# Patient Record
Sex: Female | Born: 1980 | Race: Black or African American | Hispanic: No | Marital: Married | Smoking: Never smoker
Health system: Southern US, Community
[De-identification: ages and names within clinical notes are randomized; demographics above are authoritative.]

## PROBLEM LIST (undated history)

## (undated) DIAGNOSIS — E785 Hyperlipidemia, unspecified: Secondary | ICD-10-CM

## (undated) DIAGNOSIS — B019 Varicella without complication: Secondary | ICD-10-CM

## (undated) HISTORY — PX: OTHER SURGICAL HISTORY: SHX169

## (undated) HISTORY — DX: Hyperlipidemia, unspecified: E78.5

## (undated) HISTORY — PX: BREAST BIOPSY: SHX20

## (undated) HISTORY — DX: Varicella without complication: B01.9

---

## 2011-08-08 LAB — HM PAP SMEAR: HM Pap smear: NORMAL

## 2012-05-05 ENCOUNTER — Ambulatory Visit: Payer: Self-pay | Admitting: Emergency Medicine

## 2012-05-05 VITALS — BP 120/78 | HR 80 | Temp 98.3°F | Resp 16 | Ht 62.5 in | Wt 156.6 lb

## 2012-05-05 DIAGNOSIS — H6120 Impacted cerumen, unspecified ear: Secondary | ICD-10-CM

## 2012-05-05 DIAGNOSIS — H60339 Swimmer's ear, unspecified ear: Secondary | ICD-10-CM

## 2012-05-05 DIAGNOSIS — H66009 Acute suppurative otitis media without spontaneous rupture of ear drum, unspecified ear: Secondary | ICD-10-CM

## 2012-05-05 MED ORDER — CIPROFLOXACIN-HYDROCORTISONE 0.2-1 % OT SUSP: 3.0000 [drp] | Freq: Two times a day (BID) | OTIC | Status: DC

## 2012-05-05 MED ORDER — AMOXICILLIN-POT CLAVULANATE 875-125 MG PO TABS: 1.0000 | ORAL_TABLET | Freq: Two times a day (BID) | ORAL | Status: DC

## 2012-05-05 NOTE — Progress Notes (Signed)
[facility] [address], [place] [address] [id number]  Date:  05/05/2012   Name:  Alexandra Bradley   DOB:  12/08/1980   MRN:  [id number]  PCP:  No primary provider on file.    Chief Complaint: Otalgia   History of Present Illness:  Alexandra Bradley is a 31 y.o. very pleasant female patient who presents with the following:  Ill all week with nasal congestion and pain in ear (left).  Has echo and ringing in left ear.  No fever or chills, nausea or vomiting,  No rash, no cough or stool change,  No shortness of breath or wheezing. No improvement with otc medication.  There is no problem list on file for this patient.   No past medical history on file.  No past surgical history on file.  History  Substance Use Topics  . Smoking status: Never Smoker   . Smokeless tobacco: Not on file  . Alcohol Use: No    No family history on file.  No Known Allergies  Medication list has been reviewed and updated.  No current outpatient prescriptions on file prior to visit.    Review of Systems:  As per HPI, otherwise negative.    Physical Examination: Filed Vitals:   05/05/12 1603  BP: 120/78  Pulse: 80  Temp: 98.3 F (36.8 C)  Resp: 16   Filed Vitals:   05/05/12 1603  Height: 5' 2.5" (1.588 m)  Weight: 156 lb 9.6 oz (71.033 kg)   Body mass index is 28.19 kg/(m^2). Ideal Body Weight: Weight in (lb) to have BMI = 25: 138.6    GEN: WDWN, NAD, Non-toxic, Alert & Oriented x 3 HEENT: Atraumatic, Normocephalic.  Ears and Nose: No external deformity.  Left cerumen impaction with external otitis and dull red bulging TM EXTR: No clubbing/cyanosis/edema NEURO: Normal gait.  PSYCH: Normally interactive. Conversant. Not depressed or anxious appearing.  Calm demeanor.    Assessment and Plan: Otitis media and externa Cerumen impaction removed under direct vision atraumatically augmentin cipro HC  Carmelina Dane, MD

## 2012-05-06 ENCOUNTER — Telehealth: Payer: Self-pay

## 2012-05-06 NOTE — Telephone Encounter (Signed)
Left message for patient to return call. Need to know which medication? 

## 2012-05-06 NOTE — Telephone Encounter (Signed)
Called pt back, the Cipro drops are $230 there is no generic, can you advise on alternative?

## 2012-05-06 NOTE — Telephone Encounter (Signed)
Pt seen yesterday. Pt stated needs less expensive med. Please let pt know if possible.  Best # [phone]    [facility].

## 2012-05-07 ENCOUNTER — Telehealth: Payer: Self-pay

## 2012-05-07 MED ORDER — NEOMYCIN-COLIST-HC-THONZONIUM 3.3-3-10-0.5 MG/ML OT SUSP: 3.0000 [drp] | Freq: Four times a day (QID) | OTIC | Status: DC

## 2012-05-07 MED ORDER — CIPROFLOXACIN-DEXAMETHASONE 0.3-0.1 % OT SUSP: 4.0000 [drp] | Freq: Two times a day (BID) | OTIC | Status: DC

## 2012-05-07 NOTE — Telephone Encounter (Signed)
[facility]  Advanced Heart Failure and Transplant  Heart Transplant Clinic  Follow-up Visit    Primary Care Physician: Brodsky, Mark E  Referring Provider: Brett Justin Berman  Date of Transplant: 09/13/2019  Organ(s) Transplanted: heart  Indication for transplant: Dilated Myopathy: Idiopathic  PHS increased risk donor: Yes    ID. 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    Interval History:    The patient was last seen on 11/08/21. At that time issues with pain after urologic procedure.    He continues to deal with pain issue largely from prostate surgery. He still has some bleeding and some tissue come out. He tried different strategies and nothing helped. This is really impacting quality of life. He gets tired and frustrated and does not want to take it out on his family.    ROS:  A complete ROS was performed and is negative except as documented in the HPI.      Allergies:  Patient is allergic to cats [other] and dogs [other].    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    Pericardial effusion    Hypertension    Chronic back pain    At risk for infection transmitted from donor    Acute hepatitis C virus infection    Heart transplanted (CMS-HCC)    Acute UTI    Umbilical hernia without obstruction and without gangrene    COVID-19 virus detected    Acute medial meniscus tear of left knee, sequela    Localized osteoarthritis of left knee     Past Surgical History:   Procedure Laterality Date    CARDIAC DEFIBRILLATOR PLACEMENT       PB ANESTH,SHOULDER JOINT,NOS Right      Family History   Problem Relation Name Age of Onset    Hypertension Other      Other Maternal Grandmother          kidney disease needing HD     Social History     Socioeconomic History    Marital status: Single     Spouse name: Not on file    Number of children: Not on file    Years of education: Not on file    Highest education level: Not on file   Occupational History    Not on file   Tobacco Use    Smoking status: Never    Smokeless tobacco: Never    Tobacco comments:     from friends and relatives    Substance and Sexual Activity    Alcohol use: Not Currently     Comment: Prior heavier use, but completely quit in 2016    Drug use: Yes     Comment: eats edible marijuana for pain and insomnia     Sexual activity: Not on file   Other Topics Concern    Not on file   Social History Narrative      Born in [place], also lived in [place], [place], [place], no travel, worked as a carpenter, occasional cedar, no birds, no hot tubs, worked in construction + possible asbestos exposure      Social Determinants of Health     Financial Resource Strain: Not on file   Food Insecurity: Not on file   Transportation Needs: Not on file   Physical Activity: Not on file   Stress: Not on file   Social Connections: Not on file   Intimate Partner Violence: Not on file   Housing Stability: Not on file     Current Outpatient Medications   Medication Sig    albuterol 108 (90 Base) MCG/ACT inhaler Inhale 2 puffs by mouth every 4 hours as needed for Wheezing or Shortness of Breath.    aspirin 81 MG EC tablet Take 1 tablet (81 mg) by mouth daily.    baclofen (LIORESAL) 10 MG tablet Take 2 tablets (20 mg) by mouth nightly.    Blood Glucose Monitoring Suppl (TRUE METRIX METER) w/Device KIT Use as directed    budesonide-formoterol (SYMBICORT) 160-4.5 MCG/ACT inhaler Inhale 2 puffs by mouth every 12 hours.    bumetanide (BUMEX) 1 MG tablet Take 1 tablet (1 mg) by mouth daily as needed (fluid/weight  gain). Do not take unless instructed by Transplant team.    Calcium Carb-Cholecalciferol 600-10 MG-MCG TABS Take 1 tablet by mouth 2 times daily.    Cetirizine HCl (ZERVIATE) 0.24 % SOLN Place 1 drop into both eyes 2 times daily.    clindamycin (CLEOCIN T) 1 % solution Apply 1 Application. topically 2 times daily. Apply to the red bumps on your face up to two times a day.    controlled substance agreement controlled substance agreement    diclofenac (VOLTAREN) 1 % gel Apply 2 g topically 4 times daily.    docusate sodium (COLACE) 100 MG capsule Take 1 capsule (100 mg) by mouth 2 times daily.    DULoxetine (CYMBALTA) 30 MG CR capsule Take 1 capsule (30 mg) by mouth daily.    famotidine (PEPCID) 20 MG tablet Take 1 tablet (20 mg) by mouth 2 times daily.    fluticasone propionate (FLONASE) 50 MCG/ACT nasal spray Spray 1 spray into each nostril 2 times daily.    gabapentin (NEURONTIN) 300 MG capsule Take 1 capsule (300 mg) by mouth every morning AND 1 capsule (300 mg) daily AND 2 capsules (600 mg) every evening.    hydroCHLOROthiazide (HYDRODIURIL) 25 MG tablet Take 1 tablet (25 mg) by mouth daily.    ketoconazole (NIZORAL) 2 % shampoo Use shampoo daily for dandruff    lidocaine (LIDOCAINE PAIN RELIEF) 4 % patch Apply 1 patch topically every 24 hours. Leave patch on for 12 hours, then remove for 12 hours.    lisinopril (PRINIVIL, ZESTRIL) 10 MG tablet Take 2 tablets (20 mg) by mouth daily.    magnesium oxide (MAG-OX) 400 MG tablet Take 1 tablet by mouth daily    melatonin (GNP MELATONIN MAXIMUM STRENGTH) 5 MG tablet Take 2 tablets (10 mg) by mouth at bedtime.    Multiple Vitamin (MULTIVITAMIN) TABS tablet Take 1 tablet by mouth daily.    naloxone (KLOXXADO) 8 mg/0.1 mL nasal spray Call 911! Tilt head and spray intranasally into one nostril as needed for respiratory depression. If patient does not respond or responds and then relapses, repeat using a new nasal spray every 3 minutes until emergency medical assistance  arrives.    NEEDLE, DISP, 25 G 25G X   1" MISC Use to inject testosterone    NIFEdipine (ADALAT CC) 30 MG Controlled-Release tablet Take 1 tablet (30 mg) by mouth nightly.    ondansetron (ZOFRAN) 8 MG tablet Take 1 tablet (8 mg) by mouth every 8 hours as needed for Nausea/Vomiting.    oxyCODONE (ROXICODONE) 10 MG tablet Take 1 tab every 4 hours as needed for moderate pain and 2 tabs every 4 hours as needed for severe pain. Max 10 tabs per day, 28 day supply    phenazopyridine (PYRIDIUM) 100 MG tablet Take 1 tablet (100 mg) by mouth 3 times daily.    polyethylene glycol (GLYCOLAX) 17 GM/SCOOP powder Mix 17 grams in 4-8 oz of liquide and drink by mouth daily as needed (Constipation).    pravastatin (PRAVACHOL) 40 MG tablet Take 1 tablet (40 mg) by mouth every evening.    senna (SENOKOT) 8.6 MG tablet Take 1 tablet (8.6 mg) by mouth daily.    sirolimus (RAPAMUNE) 1 MG tablet Take 2 tablets (2 mg) by mouth every morning.    SYRINGE-NEEDLE, DISP, 3 ML (B-D 3CC LUER-LOK SYR 25GX1") 25G X 1" 3 ML MISC Use as directed to inject testosterone    SYRINGE-NEEDLE, DISP, 3 ML 18G X 1-1/2" 3 ML MISC Use to draw up testosterone    tacrolimus (ENVARSUS XR) 1 MG tablet STOP TAKING since 04/06/22 - remaining on chart for dose adjustments, titratable med.    tacrolimus (ENVARSUS XR) 4 MG tablet Take 1 tablet (4 mg) by mouth every morning.    tamsulosin (FLOMAX) 0.4 MG capsule Take 1 capsule (0.4 mg) by mouth daily.    tamsulosin (FLOMAX) 0.4 MG capsule Take 1 capsule (0.4 mg) by mouth daily.    testosterone cypionate (DEPO-TESTOSTERONE) 200 MG/ML SOLN Inject 1 ml into the muscle every 14 days    traZODone (DESYREL) 50 MG tablet Take 1 tablet (50 mg) by mouth nightly.     Current Facility-Administered Medications   Medication    diphenhydrAMINE (BENADRYL) injection 50 mg    diphenhydrAMINE (BENADRYL) tablet 50 mg     Immunization History   Administered Date(s) Administered    COVID-19 (Moderna) Low Dose Red Cap >= 18 Years 09/04/2020     COVID-19 (Moderna) Red Cap >= 12 Years 10/12/2019, 11/11/2019, 03/11/2020    Hep-A/Hep-B; Twinrix, Adult 10/05/2020    Influenza Vaccine (High Dose) Quadrivalent >=65 Years 05/27/2020    Influenza Vaccine (Unspecified) 03/25/2017    Influenza Vaccine >=6 Months 06/07/2010, 06/07/2011, 08/02/2012, 04/30/2014, 04/13/2018, 04/29/2019    Pneumococcal 13 Vaccine (PREVNAR-13) 05/27/2020    Pneumococcal 23 Vaccine (PNEUMOVAX-23) 06/07/2013, 10/05/2020    Tdap 07/26/2011   Deferred Date(s) Deferred    Pneumococcal 23 Vaccine (PNEUMOVAX-23) 09/26/2019     Physical Exam:  BP 102/69 (BP Location: Right arm, BP Patient Position: Sitting, BP cuff size: Large)   Pulse 98   Temp 98.5 F (36.9 C) (Temporal)   Resp 16   Ht 5' 10" (1.778 m)   Wt 96.2 kg (212 lb)   SpO2 97%   BMI 30.42 kg/m      General Appearance: ***alert, no distress, pleasant affect, cooperative.  Heart:  JVD ***, PMI ***, normal rate and regular rhythm, no murmurs, clicks, or gallops. ***  Lungs: ***clear to auscultation and percussion. No rales, rhonchi, or wheezes noted. No chest deformities noted.  Abdomen: ***BS normal.  Abdomen soft, non-tender.  No masses or organomegaly.  Extremities:  ***no cyanosis, clubbing, or edema. Has 2+ peripheral pulses.        Lab Data:  Lab Results   Component Value Date    BUN 26 (H) 04/06/2022    CREAT 1.98 (H) 04/06/2022    CL 99 04/06/2022    NA 140 04/06/2022    K 4.4 04/06/2022    CA 9.2 04/06/2022    TBILI 0.47 04/06/2022    ALB 4.1 04/06/2022    TP 7.1 04/06/2022    AST 22 04/06/2022    ALK 76 04/06/2022    BICARB 29 04/06/2022    ALT 25 04/06/2022    GLU 126 (H) 04/06/2022     Lab Results   Component Value Date    WBC 7.9 04/06/2022    RBC 5.50 04/06/2022    HGB 15.2 04/06/2022    HCT 46.5 04/06/2022    MCV 84.5 04/06/2022    MCHC 32.7 04/06/2022    RDW 12.3 04/06/2022    PLT 162 04/06/2022    MPV 11.6 04/06/2022     Lab Results   Component Value Date    A1C 5.7 09/03/2021     Lab Results   Component Value Date     TSH 1.63 09/03/2021     Lab Results   Component Value Date    CHOL 105 09/03/2021    HDL 38 09/03/2021    LDLCALC 43 09/03/2021    TRIG 121 09/03/2021     Lab Results   Component Value Date    SIROT 11.5 04/06/2022     Lab Results   Component Value Date    FKTR 6.5 04/06/2022     No results found for: CSATR  Lab Results   Component Value Date    CMVPL Not Detected 06/25/2021     Lab Results   Component Value Date    DSA ABSENT 04/06/2022       Prior Cardiovascular Studies:   Lab Results   Component Value Date    LV Ejection Fraction 59 09/30/2021          Echo 09/30/21  Summary:   1. The left ventricular size is normal. The left ventricular systolic function is normal.   2. No left ventricular hypertrophy.   3. Normal pattern of left ventricular diastolic filling.   4. EF=59%.   5. Compared to prior study EF now 59%, was 69% 10/23/20.     LHC/IVUS 09/28/21  CONCLUSION:                                                                   1. Myocardial bridging with mild systolic compression of the mid segment    of the left anterior descending coronary artery.                              2. No angiographic evidence of coronary artery disease.                      3. Intimal thickness noted in LAD/LM up to 0.5 mm (Stable to slightly       worse compare to 2022).                                                         4. Non significant FFR at apical LAD.                                        5. Left ventricular end diastolic pressure appears normal.        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    Assessment/Plan:  # Hematuria  # Dysuria  # Chronic pain  Assessment: We had a long frank discussion about patient's chronic pain issues and the heart transplant team's role in this. I discussed with him that when I initially agreed to cover his chronic opiate prescription, this was the assumption that he would  have a provider versed in chronic pain after 3-4 months, but we are at 6 months and has unable to find one. Additionally, I had not put him on a pain contract at that time, but he recently used more opiates without asking and I informed him this was not appropriate, but because he had not established guidelines I was not going to stop at this time. However, going forward until he can establish with a pain physician, we will set up a pain contract and he will need to follow through like a usual pain clinic with us with goal of provider in 3-4 months or I may start tapering. I will augment adjuvant agents additionally for now and we can continue to work on this.  Plan:  -pain contract signed  -urine tox monthly  -clinic follow up month  -oxycodone 10 mg tablets PO, 1 tab every 4 hours moderate pain, 2 tabs every 4 hours for severe pain, no more than 10 tablets a day, total 280 per 28 days.   -diclofenac cream for joint pain  -lidocaine patch for back pain  -trial of pyridium  -increase gaba at night  -siro change as below  -cymbalta as below    # End-stage heart failure s/p orthotopic heart transplant  # Chronic Immunosuppression/Immunomodulation  Assessment: While we thought continuing sirolimus would help prevent recurrent scar tissue from prostate procedure, it may be exacerbating factors now with delayed wound healing. Will try mmf for 1 month.  Plan:   - continue envarsus 6 mg daily, goal trough 4-8  - HOLD sirolimus 3 mg daily, goal trough 4-8 for at least 1 month  - start mmf 1000 mg bid for one month to allow healing  - Continue to monitor for renal toxicities, infection risk and malignancy risk  - continue pravastatin 40 mg daily  - continue aspirin 81 mg daily    # Hypertension  Assessment: controlled  Plan:  -continue lisinopril 20 mg daily  -resume hctz  -nifedipine 30 mg daily    # Dyslipidemia  -continue pravastatin 40 mg daily    # Depression  Assessment: improved mood  Plan:  -increase cymbalta to 120  mg daily     RTC in 1 month       Nicholas W Wettersten, MD  Advanced Heart Failure, Mechanical Circulatory Support, Transplant  Pgr: [phone]

## 2012-05-07 NOTE — Telephone Encounter (Signed)
Pharmacy advised the Cipro with hydrocortizone is not generic, but Cipro only drops does come generic, please advise

## 2012-05-07 NOTE — Telephone Encounter (Signed)
Please advise patient that Rx for Ciprodex sent.

## 2012-05-07 NOTE — Telephone Encounter (Signed)
Please call the pharmacy to help troubleshoot this.  Do they not carry the generic?  Is that the problem?  Does she have insurance?

## 2012-05-07 NOTE — Telephone Encounter (Signed)
Thanks, patient works at [employer], no need for phone call per last phone mssg.

## 2012-05-07 NOTE — Telephone Encounter (Signed)
PATIENT CALLED [place] BACK AFTER BEING SEEN THIS WEEKEND REQUESTING A GENERIC PRESCRIPTION FOR HER EAR INFECTION.  WE SENT IN ANOTHER ONE, BUT NOT A GENERIC.  SHE SAYS HER INS. WILL COVER CIPRODEX.  CAN WE PLEASE CALL HER AT [phone].  WANTS TO USE THE [facility] SHE WORKS AT [facility] [address] IN [place].

## 2012-09-07 ENCOUNTER — Ambulatory Visit: Payer: Self-pay | Admitting: Family Medicine

## 2012-10-12 ENCOUNTER — Ambulatory Visit (INDEPENDENT_AMBULATORY_CARE_PROVIDER_SITE_OTHER): Payer: BC Managed Care – PPO | Admitting: Family Medicine

## 2012-10-12 ENCOUNTER — Encounter: Payer: Self-pay | Admitting: Family Medicine

## 2012-10-12 VITALS — BP 120/70 | HR 68 | Temp 98.5°F | Ht 62.5 in | Wt 167.2 lb

## 2012-10-12 DIAGNOSIS — B009 Herpesviral infection, unspecified: Secondary | ICD-10-CM

## 2012-10-12 DIAGNOSIS — E785 Hyperlipidemia, unspecified: Secondary | ICD-10-CM

## 2012-10-12 DIAGNOSIS — Z0000 Encounter for general adult medical examination without abnormal findings: Secondary | ICD-10-CM

## 2012-10-12 DIAGNOSIS — B001 Herpesviral vesicular dermatitis: Secondary | ICD-10-CM | POA: Insufficient documentation

## 2012-10-12 LAB — HEPATIC FUNCTION PANEL
ALT: 14 U/L (ref 0–35)
Alkaline Phosphatase: 47 U/L (ref 39–117)
Bilirubin, Direct: 0.1 mg/dL (ref 0.0–0.3)
Total Protein: 7.4 g/dL (ref 6.0–8.3)

## 2012-10-12 LAB — CBC WITH DIFFERENTIAL/PLATELET
Basophils Absolute: 0 10*3/uL (ref 0.0–0.1)
Hemoglobin: 13.4 g/dL (ref 12.0–15.0)
Lymphocytes Relative: 48.5 % — ABNORMAL HIGH (ref 12.0–46.0)
Monocytes Relative: 8.3 % (ref 3.0–12.0)
Neutro Abs: 1.9 10*3/uL (ref 1.4–7.7)
RDW: 12.6 % (ref 11.5–14.6)
WBC: 4.6 10*3/uL (ref 4.5–10.5)

## 2012-10-12 LAB — LDL CHOLESTEROL, DIRECT: Direct LDL: 144.7 mg/dL

## 2012-10-12 LAB — BASIC METABOLIC PANEL
CO2: 26 mEq/L (ref 19–32)
Chloride: 103 mEq/L (ref 96–112)
Creatinine, Ser: 0.8 mg/dL (ref 0.4–1.2)
Sodium: 136 mEq/L (ref 135–145)

## 2012-10-12 LAB — LIPID PANEL: Total CHOL/HDL Ratio: 4

## 2012-10-12 MED ORDER — VALACYCLOVIR HCL 1 G PO TABS: ORAL_TABLET | ORAL | Status: DC

## 2012-10-12 NOTE — Assessment & Plan Note (Signed)
New.  Check labs to see if pt has HSV 1 or 2 given husband's known HSV 2.  Valtrex prn for break outs.  Reviewed supportive care and red flags that should prompt return.  Pt expressed understanding and is in agreement w/ plan.

## 2012-10-12 NOTE — Patient Instructions (Addendum)
Follow up in 1 year or as needed We'll notify you of your lab results and make any changes if needed Start the Valtrex as needed for cold sores- 2 tabs q12 x2 doses Call with any questions or concerns Welcome!  We're glad to have you!

## 2012-10-12 NOTE — Assessment & Plan Note (Signed)
Pt's PE WNL.  UTD on GYN.  Check labs.  Anticipatory guidance provided.  

## 2012-10-12 NOTE — Assessment & Plan Note (Signed)
New.  Pt reports levels were elevated previously but no meds were started due to young age and pregnancy.  + family hx.  Check labs.  Determine whether meds are required.  Discussed red yeast rice supplement as pt prefers to do natural products if possible.  Will follow.

## 2012-10-12 NOTE — Progress Notes (Signed)
  Subjective:    Patient ID: Alexandra Bradley, female    DOB: 1980-12-10, 32 y.o.   MRN: [id number]  HPI New to establish, recently moved from [place].  UTD on pap.  Hyperlipidemia- prior to pregnancy pt had lipids checked and total cholesterol was 250.  Interested in repeat labs today.  + family hx.  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  Has not had vaginal outbreak.  Has never used antiviral meds.  More interested in Zovirax prn than a daily controller med.  Having sxs either monthly or every other month.  Review of Systems Patient reports no vision/ hearing changes, adenopathy,fever, weight change,  persistant/recurrent hoarseness , swallowing issues, chest pain, palpitations, edema, persistant/recurrent cough, hemoptysis, dyspnea (rest/exertional/paroxysmal nocturnal), gastrointestinal bleeding (melena, rectal bleeding), abdominal pain, significant heartburn, bowel changes, GU symptoms (dysuria, hematuria, incontinence), Gyn symptoms (abnormal  bleeding, pain),  syncope, focal weakness, memory loss, numbness & tingling, skin/hair/nail changes, abnormal bruising or bleeding, anxiety, or depression.     Objective:   Physical Exam General Appearance:    Alert, cooperative, no distress, appears stated age  Head:    Normocephalic, without obvious abnormality, atraumatic  Eyes:    PERRL, conjunctiva/corneas clear, EOM's intact, fundi    benign, both eyes  Ears:    Normal TM's and external ear canals, both ears  Nose:   Nares normal, septum midline, mucosa normal, no drainage    or sinus tenderness  Throat:   Lips, mucosa, and tongue normal; teeth and gums normal  Neck:   Supple, symmetrical, trachea midline, no adenopathy;    Thyroid: no enlargement/tenderness/nodules  Back:     Symmetric, no curvature, ROM normal, no CVA tenderness  Lungs:     Clear to auscultation bilaterally, respirations unlabored  Chest Wall:    No tenderness or deformity   Heart:     Regular rate and rhythm, S1 and S2 normal, no murmur, rub   or gallop  Breast Exam:    Deferred to GYN  Abdomen:     Soft, non-tender, bowel sounds active all four quadrants,    no masses, no organomegaly  Genitalia:    Deferred to GYN  Rectal:    Extremities:   Extremities normal, atraumatic, no cyanosis or edema  Pulses:   2+ and symmetric all extremities  Skin:   Skin color, texture, turgor normal, no rashes or lesions  Lymph nodes:   Cervical, supraclavicular, and axillary nodes normal  Neurologic:   CNII-XII intact, normal strength, sensation and reflexes    throughout          Assessment & Plan:

## 2012-10-15 ENCOUNTER — Encounter: Payer: Self-pay | Admitting: Family Medicine

## 2012-10-16 ENCOUNTER — Ambulatory Visit: Payer: BC Managed Care – PPO

## 2012-10-16 DIAGNOSIS — R7989 Other specified abnormal findings of blood chemistry: Secondary | ICD-10-CM

## 2012-10-17 ENCOUNTER — Telehealth: Payer: Self-pay | Admitting: *Deleted

## 2012-10-17 DIAGNOSIS — R6889 Other general symptoms and signs: Secondary | ICD-10-CM

## 2012-10-17 LAB — VITAMIN D 1,25 DIHYDROXY
Vitamin D 1, 25 (OH)2 Total: 56 pg/mL (ref 18–72)
Vitamin D2 1, 25 (OH)2: <8 pg/mL

## 2012-10-17 LAB — T3: T3, Total: 72.4 ng/dL — ABNORMAL LOW (ref 80.0–204.0)

## 2012-10-17 NOTE — Telephone Encounter (Signed)
Message copied by Verdie Shire on Wed Oct 17, 2012  4:53 PM ------      Message from: Sheliah Hatch      Created: Wed Oct 17, 2012  8:21 AM       Pt has low TSH and slightly low T3 which don't correlate.  Will repeat labs in 1 month (lab only TSH, free T3/free T4) ------

## 2012-10-17 NOTE — Telephone Encounter (Signed)
Spoke with the pt and informed her of recent lab results and note.  Pt understood and agreed.  Pt scheduled lab appt, and lab orders entered.//AB/CMA

## 2012-11-14 ENCOUNTER — Other Ambulatory Visit (INDEPENDENT_AMBULATORY_CARE_PROVIDER_SITE_OTHER): Payer: BC Managed Care – PPO

## 2012-11-14 DIAGNOSIS — R6889 Other general symptoms and signs: Secondary | ICD-10-CM

## 2012-11-14 LAB — T4, FREE: Free T4: 1.08 ng/dL (ref 0.60–1.60)

## 2012-11-15 ENCOUNTER — Encounter: Payer: Self-pay | Admitting: *Deleted

## 2013-08-07 ENCOUNTER — Encounter: Payer: Self-pay | Admitting: General Practice

## 2013-08-07 ENCOUNTER — Encounter: Payer: Self-pay | Admitting: Family Medicine

## 2013-08-07 ENCOUNTER — Ambulatory Visit (INDEPENDENT_AMBULATORY_CARE_PROVIDER_SITE_OTHER): Payer: Managed Care, Other (non HMO) | Admitting: Family Medicine

## 2013-08-07 VITALS — BP 128/74 | HR 73 | Temp 98.5°F | Resp 16 | Wt 147.5 lb

## 2013-08-07 DIAGNOSIS — E785 Hyperlipidemia, unspecified: Secondary | ICD-10-CM

## 2013-08-07 DIAGNOSIS — R1110 Vomiting, unspecified: Secondary | ICD-10-CM

## 2013-08-07 LAB — BASIC METABOLIC PANEL
BUN: 11 mg/dL (ref 6–23)
CALCIUM: 9 mg/dL (ref 8.4–10.5)
CHLORIDE: 106 meq/L (ref 96–112)
CO2: 26 meq/L (ref 19–32)
CREATININE: 0.9 mg/dL (ref 0.4–1.2)
GFR: 95.47 mL/min (ref >60.00)
GLUCOSE: 78 mg/dL (ref 70–99)
Potassium: 3.5 mEq/L (ref 3.5–5.1)
Sodium: 138 mEq/L (ref 135–145)

## 2013-08-07 LAB — CBC WITH DIFFERENTIAL/PLATELET
BASOS PCT: 0.7 % (ref 0.0–3.0)
Basophils Absolute: 0 10*3/uL (ref 0.0–0.1)
Eosinophils Absolute: 0.1 10*3/uL (ref 0.0–0.7)
Eosinophils Relative: 1.8 % (ref 0.0–5.0)
HEMATOCRIT: 36.9 % (ref 36.0–46.0)
HEMOGLOBIN: 12.4 g/dL (ref 12.0–15.0)
LYMPHS ABS: 2.4 10*3/uL (ref 0.7–4.0)
LYMPHS PCT: 43 % (ref 12.0–46.0)
MCHC: 33.7 g/dL (ref 30.0–36.0)
MCV: 91.5 fl (ref 78.0–100.0)
MONOS PCT: 5.1 % (ref 3.0–12.0)
Monocytes Absolute: 0.3 10*3/uL (ref 0.1–1.0)
NEUTROS ABS: 2.7 10*3/uL (ref 1.4–7.7)
Neutrophils Relative %: 49.4 % (ref 43.0–77.0)
Platelets: 311 10*3/uL (ref 150.0–400.0)
RBC: 4.03 Mil/uL (ref 3.87–5.11)
RDW: 12.7 % (ref 11.5–14.6)
WBC: 5.5 10*3/uL (ref 4.5–10.5)

## 2013-08-07 LAB — LIPID PANEL
CHOL/HDL RATIO: 3
CHOLESTEROL: 166 mg/dL (ref 0–200)
HDL: 56.3 mg/dL (ref >39.00)
LDL CALC: 100 mg/dL — AB (ref 0–99)
TRIGLYCERIDES: 51 mg/dL (ref 0.0–149.0)
VLDL: 10.2 mg/dL (ref 0.0–40.0)

## 2013-08-07 LAB — HEPATIC FUNCTION PANEL
ALT: 12 U/L (ref 0–35)
AST: 16 U/L (ref 0–37)
Albumin: 3.9 g/dL (ref 3.5–5.2)
Alkaline Phosphatase: 37 U/L — ABNORMAL LOW (ref 39–117)
BILIRUBIN DIRECT: 0 mg/dL (ref 0.0–0.3)
BILIRUBIN TOTAL: 0.6 mg/dL (ref 0.3–1.2)
TOTAL PROTEIN: 6.8 g/dL (ref 6.0–8.3)

## 2013-08-07 NOTE — Progress Notes (Signed)
   Subjective:    Patient ID: Morton PetersCatrina H Lege, female    DOB: 1980-07-29, 33 y.o.   MRN: [id number]  HPI Hyperlipidemia- noted on last labs, did not want to start meds.  Has lost 20 lbs since last visit.  Due to have lipids rechecked.  Vomiting- pt did '3 day refresh cleanse' last week.  Did not find herself using bathroom but was having increased bloating and subsequent vomiting.  Pt has had ongoing issues w/ constipation.   Review of Systems For ROS see HPI     Objective:   Physical Exam  Vitals reviewed. Constitutional: She is oriented to person, place, and time. She appears well-developed and well-nourished. No distress.  HENT:  Head: Normocephalic and atraumatic.  Eyes: Conjunctivae and EOM are normal. Pupils are equal, round, and reactive to light.  Neck: Normal range of motion. Neck supple. No thyromegaly present.  Cardiovascular: Normal rate, regular rhythm, normal heart sounds and intact distal pulses.   No murmur heard. Pulmonary/Chest: Effort normal and breath sounds normal. No respiratory distress.  Abdominal: Soft. Bowel sounds are normal. She exhibits no distension. There is no tenderness. There is no rebound and no guarding.  Musculoskeletal: She exhibits no edema.  Lymphadenopathy:    She has no cervical adenopathy.  Neurological: She is alert and oriented to person, place, and time.  Skin: Skin is warm and dry.  Psychiatric: She has a normal mood and affect. Her behavior is normal.          Assessment & Plan:

## 2013-08-07 NOTE — Assessment & Plan Note (Signed)
New.  Occurred after 3 days of extremely high fiber diet coupled w/ chronic constipation and bloating.  No sxs since.  Suspect this was due to her short term dietary changes.  Check labs to r/o infxn.  Reviewed supportive care and red flags that should prompt return.  Pt expressed understanding and is in agreement w/ plan.

## 2013-08-07 NOTE — Assessment & Plan Note (Signed)
Chronic problem.  Pt has been resistant to meds.  Has lost 20 lbs since last visit.  Applauded efforts.  Repeat labs.

## 2013-08-07 NOTE — Patient Instructions (Signed)
Schedule your complete physical in 4-6 months We'll notify you of your lab results and make any changes if needed Keep up the good work on healthy diet and regular exercise- you look great! Start daily probiotics (Align works great) Drink plenty of fluids If you are going to add fiber, do it slowly! Happy New Year!

## 2013-08-07 NOTE — Progress Notes (Signed)
Pre visit review using our clinic review tool, if applicable. No additional management support is needed unless otherwise documented below in the visit note. 

## 2013-10-11 ENCOUNTER — Ambulatory Visit (INDEPENDENT_AMBULATORY_CARE_PROVIDER_SITE_OTHER): Payer: Managed Care, Other (non HMO) | Admitting: Family Medicine

## 2013-10-11 ENCOUNTER — Encounter: Payer: Self-pay | Admitting: Family Medicine

## 2013-10-11 VITALS — BP 130/82 | HR 71 | Temp 98.2°F | Resp 16 | Wt 144.4 lb

## 2013-10-11 DIAGNOSIS — M25569 Pain in unspecified knee: Secondary | ICD-10-CM | POA: Insufficient documentation

## 2013-10-11 MED ORDER — MELOXICAM 15 MG PO TABS: 15.0000 mg | ORAL_TABLET | Freq: Every day | ORAL | Status: DC

## 2013-10-11 NOTE — Assessment & Plan Note (Signed)
New.  Pt w/ marked crepitus on PE on R, less on L.  Start daily Mobic.  Refer to ortho for complete evaluation and tx.  Reviewed supportive care and red flags that should prompt return.  Pt expressed understanding and is in agreement w/ plan.

## 2013-10-11 NOTE — Patient Instructions (Signed)
Follow up as needed Start the Mobic daily- take w/ food We'll call you with your ortho appt ICE! Call with any questions or concerns Hang in there!!!

## 2013-10-11 NOTE — Progress Notes (Signed)
Pre visit review using our clinic review tool, if applicable. No additional management support is needed unless otherwise documented below in the visit note. 

## 2013-10-11 NOTE — Progress Notes (Signed)
   Subjective:    Patient ID: Alexandra Bradley, female    DOB: 10/28/1980, 33 y.o.   MRN: [id number]  HPI Knee pain- sxs started ~1 yr ago.  Used to run regularly and last year attempted to restart but was unable to continue b/c of still, painful knee.  Now having pain w/ stairs, cardio.  Recently was on floor w/ daughter and rolled over to get up and R knee 'popped out and popped back in', very painful.  No swelling.  + crepitus.   Review of Systems For ROS see HPI     Objective:   Physical Exam  Vitals reviewed. Constitutional: She is oriented to person, place, and time. She appears well-developed and well-nourished. No distress.  Cardiovascular: Intact distal pulses.   Musculoskeletal: She exhibits no edema and no tenderness.  Pt w/ marked crepitus w/ flexion and extension of R knee Less crepitus w/ full extension of L knee No joint line tenderness + patellar grind on R, (-) on L  Neurological: She is alert and oriented to person, place, and time. She has normal reflexes. Coordination normal.  Skin: Skin is warm and dry. No erythema.          Assessment & Plan:

## 2013-10-30 ENCOUNTER — Ambulatory Visit: Payer: Managed Care, Other (non HMO) | Attending: Orthopaedic Surgery | Admitting: Physical Therapy

## 2013-10-30 DIAGNOSIS — M25669 Stiffness of unspecified knee, not elsewhere classified: Secondary | ICD-10-CM | POA: Insufficient documentation

## 2013-10-30 DIAGNOSIS — IMO0001 Reserved for inherently not codable concepts without codable children: Secondary | ICD-10-CM | POA: Insufficient documentation

## 2013-10-30 DIAGNOSIS — M25569 Pain in unspecified knee: Secondary | ICD-10-CM | POA: Insufficient documentation

## 2013-11-13 ENCOUNTER — Ambulatory Visit (INDEPENDENT_AMBULATORY_CARE_PROVIDER_SITE_OTHER): Payer: Managed Care, Other (non HMO) | Admitting: Nurse Practitioner

## 2013-11-13 VITALS — BP 114/72 | HR 72 | Temp 98.1°F | Wt 143.0 lb

## 2013-11-13 DIAGNOSIS — H6690 Otitis media, unspecified, unspecified ear: Secondary | ICD-10-CM

## 2013-11-13 DIAGNOSIS — J019 Acute sinusitis, unspecified: Secondary | ICD-10-CM

## 2013-11-13 MED ORDER — AMOXICILLIN-POT CLAVULANATE 875-125 MG PO TABS: 1.0000 | ORAL_TABLET | Freq: Two times a day (BID) | ORAL | Status: DC

## 2013-11-13 MED ORDER — FLUTICASONE PROPIONATE 50 MCG/ACT NA SUSP: 1.0000 | Freq: Two times a day (BID) | NASAL | Status: DC

## 2013-11-13 NOTE — Patient Instructions (Signed)
You have a L ear infection and acute sinusitis. Augmentin will cover both. Start antibiotic. Eat yogurt daily at lunch or afternoon to help prevent diarrhea that can be caused by antibiotic. Start daily sinus rinses (Neilmed Sinus rinse) for at least 5-7 days. You may use pseudoephedrine 30 mg twice daily for 4-6 days. Use flonase to facilitate eustacian tube function. Take 200 - 400 mg ibuporphen twice daily with food for a few days to help with ear pain and decrease inflammation in ear & sinuses. Please call for re-evaluation if you are not improving.   Sinusitis Sinusitis is redness, soreness, and swelling (inflammation) of the paranasal sinuses. Paranasal sinuses are air pockets within the bones of your face (beneath the eyes, the middle of the forehead, or above the eyes). In healthy paranasal sinuses, mucus is able to drain out, and air is able to circulate through them by way of your nose. However, when your paranasal sinuses are inflamed, mucus and air can become trapped. This can allow bacteria and other germs to grow and cause infection. Sinusitis can develop quickly and last only a short time (acute) or continue over a long period (chronic). Sinusitis that lasts for more than 12 weeks is considered chronic.  CAUSES  Causes of sinusitis include:  Allergies.  Structural abnormalities, such as displacement of the cartilage that separates your nostrils (deviated septum), which can decrease the air flow through your nose and sinuses and affect sinus drainage.  Functional abnormalities, such as when the small hairs (cilia) that line your sinuses and help remove mucus do not work properly or are not present. SYMPTOMS  Symptoms of acute and chronic sinusitis are the same. The primary symptoms are pain and pressure around the affected sinuses. Other symptoms include:  Upper toothache.  Earache.  Headache.  Bad breath.  Decreased sense of smell and taste.  A cough, which worsens when you  are lying flat.  Fatigue.  Fever.  Thick drainage from your nose, which often is green and may contain pus (purulent).  Swelling and warmth over the affected sinuses. DIAGNOSIS  Your caregiver will perform a physical exam. During the exam, your caregiver may:  Look in your nose for signs of abnormal growths in your nostrils (nasal polyps).  Tap over the affected sinus to check for signs of infection.  View the inside of your sinuses (endoscopy) with a special imaging device with a light attached (endoscope), which is inserted into your sinuses. If your caregiver suspects that you have chronic sinusitis, one or more of the following tests may be recommended:  Allergy tests.  Nasal culture A sample of mucus is taken from your nose and sent to a lab and screened for bacteria.  Nasal cytology A sample of mucus is taken from your nose and examined by your caregiver to determine if your sinusitis is related to an allergy. TREATMENT  Most cases of acute sinusitis are related to a viral infection and will resolve on their own within 10 days. Sometimes medicines are prescribed to help relieve symptoms (pain medicine, decongestants, nasal steroid sprays, or saline sprays).  However, for sinusitis related to a bacterial infection, your caregiver will prescribe antibiotic medicines. These are medicines that will help kill the bacteria causing the infection.  Rarely, sinusitis is caused by a fungal infection. In theses cases, your caregiver will prescribe antifungal medicine. For some cases of chronic sinusitis, surgery is needed. Generally, these are cases in which sinusitis recurs more than 3 times per year, despite  other treatments. HOME CARE INSTRUCTIONS   Drink plenty of water. Water helps thin the mucus so your sinuses can drain more easily.  Use a humidifier.  Inhale steam 3 to 4 times a day (for example, sit in the bathroom with the shower running).  Apply a warm, moist washcloth to  your face 3 to 4 times a day, or as directed by your caregiver.  Use saline nasal sprays to help moisten and clean your sinuses.  Take over-the-counter or prescription medicines for pain, discomfort, or fever only as directed by your caregiver. SEEK IMMEDIATE MEDICAL CARE IF:  You have increasing pain or severe headaches.  You have nausea, vomiting, or drowsiness.  You have swelling around your face.  You have vision problems.  You have a stiff neck.  You have difficulty breathing. MAKE SURE YOU:   Understand these instructions.  Will watch your condition.  Will get help right away if you are not doing well or get worse. Document Released: 07/11/2005 Document Revised: 10/03/2011 Document Reviewed: 07/26/2011 [facility] Patient Information 2014 [place], [place].  Otitis Media, Adult Otitis media is redness, soreness, and swelling (inflammation) of the middle ear. Otitis media may be caused by allergies or, most commonly, by infection. Often it occurs as a complication of the common cold. SIGNS AND SYMPTOMS Symptoms of otitis media may include:  Earache.  Fever.  Ringing in your ear.  Headache.  Leakage of fluid from the ear. DIAGNOSIS To diagnose otitis media, your health care provider will examine your ear with an otoscope. This is an instrument that allows your health care provider to see into your ear in order to examine your eardrum. Your health care provider also will ask you questions about your symptoms. TREATMENT  Typically, otitis media resolves on its own within 3 5 days. Your health care provider may prescribe medicine to ease your symptoms of pain. If otitis media does not resolve within 5 days or is recurrent, your health care provider may prescribe antibiotic medicines if he or she suspects that a bacterial infection is the cause. HOME CARE INSTRUCTIONS   Take your medicine as directed until it is gone, even if you feel better after the first few  days.  Only take over-the-counter or prescription medicines for pain, discomfort, or fever as directed by your health care provider.  Follow up with your health care provider as directed. SEEK MEDICAL CARE IF:  You have otitis media only in one ear or bleeding from your nose or both.  You notice a lump on your neck.  You are not getting better in 3 5 days.  You feel worse instead of better. SEEK IMMEDIATE MEDICAL CARE IF:   You have pain that is not controlled with medicine.  You have swelling, redness, or pain around your ear or stiffness in your neck.  You notice that part of your face is paralyzed.  You notice that the bone behind your ear (mastoid) is tender when you touch it. MAKE SURE YOU:   Understand these instructions.  Will watch your condition.  Will get help right away if you are not doing well or get worse. Document Released: 04/15/2004 Document Revised: 05/01/2013 Document Reviewed: 02/05/2013 [facility] Patient Information 2014 [place], [place].

## 2013-11-13 NOTE — Progress Notes (Signed)
   Subjective:    Patient ID: Alexandra Bradley, female    DOB: 1980/08/14, 33 y.o.   MRN: [id number]  Otalgia  There is pain in the left ear. This is a new problem. The current episode started 1 to 4 weeks ago (1 wk). The problem has been gradually worsening. There has been no fever. The pain is mild. Associated symptoms include drainage, headaches, hearing loss and a sore throat. Pertinent negatives include no abdominal pain, coughing or diarrhea. Associated symptoms comments: Nasal drainage, occasional cough with post nasal drip, sinus pressure.. She [facility] for the [facility]. The treatment provided no relief.      Review of Systems  Constitutional: Negative for fever, chills, activity change, appetite change and fatigue.  HENT: Positive for congestion, ear pain, hearing loss, postnasal drip, sore throat and voice change (nasal).   Respiratory: Negative for cough, chest tightness and wheezing.   Gastrointestinal: Negative for abdominal pain and diarrhea.  Neurological: Positive for headaches.       Objective:   Physical Exam  Vitals reviewed. Constitutional: She is oriented to person, place, and time. She appears well-developed and well-nourished. No distress.  HENT:  Head: Normocephalic and atraumatic.  Right Ear: External ear normal. Tympanic membrane is retracted.  Left Ear: External ear normal. Tympanic membrane is injected, erythematous and bulging. A middle ear effusion is present.  Mouth/Throat: Oropharynx is clear and moist. No oropharyngeal exudate.  Eyes: Conjunctivae are normal. Right eye exhibits no discharge. Left eye exhibits no discharge.  Neck: Normal range of motion. Neck supple. No thyromegaly present.  Cardiovascular: Normal rate, regular rhythm and normal heart sounds.   Pulmonary/Chest: Effort normal and breath sounds normal. No respiratory distress. She has no wheezes. She has no rales.  Lymphadenopathy:    She has no  cervical adenopathy.  Neurological: She is alert and oriented to person, place, and time.  Skin: Skin is warm and dry.  Psychiatric: She has a normal mood and affect. Her behavior is normal. Thought content normal.          Assessment & Plan:  1. Otitis media, L Failed axithromycin - amoxicillin-clavulanate (AUGMENTIN) 875-125 MG per tablet; Take 1 tablet by mouth 2 (two) times daily.  Dispense: 14 tablet; Refill: 0 - fluticasone (FLONASE) 50 MCG/ACT nasal spray; Place 1 spray into both nostrils 2 (two) times daily.  Dispense: 16 g; Refill: 1  2. Acute sinus infection 1 week - amoxicillin-clavulanate (AUGMENTIN) 875-125 MG per tablet; Take 1 tablet by mouth 2 (two) times daily.  Dispense: 14 tablet; Refill: 0 Daily sinus rinse. See pt instructions. F/u PRN

## 2013-11-13 NOTE — Progress Notes (Signed)
Pre-visit discussion using our clinic review tool. No additional management support is needed unless otherwise documented below in the visit note.  

## 2013-11-14 ENCOUNTER — Telehealth: Payer: Self-pay | Admitting: Family Medicine

## 2013-11-14 NOTE — Telephone Encounter (Signed)
Caller name:Jamesha Bells Relation to pt: patient PCP:Dr Tabori Call back number:[phone]  Pharmacy:[id number] - [place] ([place]), [place] - [address]   Reason for call: Patient called to ask was it okay to take Neilmed Sinus rinse while she has an ear infection. Please advise

## 2013-11-14 NOTE — Telephone Encounter (Signed)
LM for patient that it is ok to use the sinus while she has the ear infection.

## 2014-03-05 ENCOUNTER — Telehealth: Payer: Self-pay

## 2014-03-05 NOTE — Telephone Encounter (Signed)
Error

## 2015-04-15 ENCOUNTER — Ambulatory Visit (INDEPENDENT_AMBULATORY_CARE_PROVIDER_SITE_OTHER): Payer: Managed Care, Other (non HMO) | Admitting: Medical

## 2015-04-15 ENCOUNTER — Encounter: Payer: Self-pay | Admitting: Medical

## 2015-04-15 VITALS — BP 116/78 | HR 81 | Temp 97.7°F | Resp 16 | Wt 149.0 lb

## 2015-04-15 DIAGNOSIS — J208 Acute bronchitis due to other specified organisms: Secondary | ICD-10-CM

## 2015-04-15 DIAGNOSIS — R059 Cough, unspecified: Secondary | ICD-10-CM

## 2015-04-15 DIAGNOSIS — R05 Cough: Secondary | ICD-10-CM | POA: Diagnosis not present

## 2015-04-15 MED ORDER — HYDROCODONE-HOMATROPINE 5-1.5 MG/5ML PO SYRP: 5.0000 mL | ORAL_SOLUTION | Freq: Three times a day (TID) | ORAL | Status: DC | PRN

## 2015-04-15 MED ORDER — ALBUTEROL SULFATE HFA 108 (90 BASE) MCG/ACT IN AERS: 2.0000 | INHALATION_SPRAY | Freq: Four times a day (QID) | RESPIRATORY_TRACT | Status: DC | PRN

## 2015-04-15 MED ORDER — AZITHROMYCIN 250 MG PO TABS: ORAL_TABLET | ORAL | Status: DC

## 2015-04-15 NOTE — Progress Notes (Signed)
Pre visit review using our clinic review tool, if applicable. No additional management support is needed unless otherwise documented below in the visit note. 

## 2015-04-15 NOTE — Patient Instructions (Addendum)
For your cough persisting for one month will prescribe hycodan, and azithromycin. Can continue otc antihistamine and will make albuterol available if coughing despite other measures. Could use as trial to see if stops cough.  If cough persists by Monday then recommend getting cxr.   Follow up 7-10 days or as needed.

## 2015-04-15 NOTE — Progress Notes (Signed)
Subjective:    Patient ID: Alexandra Bradley, female    DOB: 11/24/1980, 34 y.o.   MRN: [id number]  HPI  Pt in for cough. Pt states almost one month. Pt denies wheezing. When she breaths deep, and laughs will get worse. But also random cough. Pt tried flonase, zytrec, benadryl, delsym and mucinex.(but cough persists)Cough is still random. Pt not coughing at night. No heartburn. Pt denies hx of allergies. Pt states at beginning runny nose and st initially. Also had hoarse voice. Pt may have some pnd. States some mucous in back of throat.  Pt never used inhalers before.   LMP- 2 months ago. Pt is on contraceptive tablets.  She states  Doubled up on birth control pill so she would not have cycle while on cruise.(pt is a pharmacist)   Review of Systems  Constitutional: Negative for fever, chills and fatigue.  HENT: Positive for congestion, postnasal drip and sore throat. Negative for sinus pressure and trouble swallowing.   Respiratory: Positive for cough. Negative for chest tightness, shortness of breath and wheezing.   Cardiovascular: Negative for chest pain and palpitations.  Gastrointestinal: Negative for abdominal pain and anal bleeding.  Musculoskeletal: Negative for back pain.  Neurological: Negative for seizures, facial asymmetry and headaches.  Hematological: Negative for adenopathy. Does not bruise/bleed easily.   Past Medical History  Diagnosis Date  . Chicken pox     Social History   Social History  . Marital Status: Married    Spouse Name: N/A  . Number of Children: N/A  . Years of Education: N/A   Occupational History  . Not on file.   Social History Main Topics  . Smoking status: Never Smoker   . Smokeless tobacco: Not on file  . Alcohol Use: No  . Drug Use: No  . Sexual Activity: Yes   Other Topics Concern  . Not on file   Social History Narrative    Past Surgical History  Procedure Laterality Date  . Breast biopsy  2005 and 2006    Family History    Problem Relation Age of Onset  . Arthritis Mother   . Hyperlipidemia Mother   . Hypertension Mother   . Arthritis Father   . Hyperlipidemia Father   . Hypertension Father   . Diabetes Father   . Cancer Sister     breast  . Hyperlipidemia Sister   . Hyperlipidemia Brother   . Arthritis Maternal Grandmother     No Known Allergies  Current Outpatient Prescriptions on File Prior to Visit  Medication Sig Dispense Refill  . fluticasone (FLONASE) 50 MCG/ACT nasal spray Place 1 spray into both nostrils 2 (two) times daily. 16 g 1  . valACYclovir (VALTREX) 1000 MG tablet 2 tabs q12 x2 doses.  Repeat prn. 20 tablet 3   No current facility-administered medications on file prior to visit.    BP 116/78 mmHg  Pulse 81  Temp(Src) 97.7 F (36.5 C) (Oral)  Resp 16  Wt 149 lb (67.586 kg)  SpO2 98%       Objective:   Physical Exam  General  Mental Status - Alert. General Appearance - Well groomed. Not in acute distress.  Skin Rashes- No Rashes.  HEENT Head- Normal. Ear Auditory Canal - Left- Normal. Right - Normal.Tympanic Membrane- Left- Normal. Right- Normal. Eye Sclera/Conjunctiva- Left- Normal. Right- Normal. Nose & Sinuses Nasal Mucosa- Left-  Boggy and Congested. Right-  Boggy and  Congested.Bilateral no  maxillary and no  frontal sinus pressure.  Mouth & Throat Lips: Upper Lip- Normal: no dryness, cracking, pallor, cyanosis, or vesicular eruption. Lower Lip-Normal: no dryness, cracking, pallor, cyanosis or vesicular eruption. Buccal Mucosa- Bilateral- No Aphthous ulcers. Oropharynx- No Discharge or Erythema. +pnd Tonsils: Characteristics- Bilateral- No Erythema or Congestion. Size/Enlargement- Bilateral- No enlargement. Discharge- bilateral-None.  Neck Neck- Supple. No Masses.   Chest and Lung Exam Auscultation: Breath Sounds:-Clear even and unlabored.  Cardiovascular Auscultation:Rythm- Regular, rate and rhythm. Murmurs & Other Heart Sounds:Ausculatation of  the heart reveal- No Murmurs.  Lymphatic Head & Neck General Head & Neck Lymphatics: Bilateral: Description- No Localized lymphadenopathy.       Assessment & Plan:  For your cough persisting for one month will prescribe hycodan, and azithromycin. Can continue otc antihistamine and will make albuterol available if coughing despite other measures. Could use as trial to see if stops cough.  If cough persists by Monday then recommend getting cxr.   Follow up 7-10 days or as needed.

## 2015-06-26 LAB — OB RESULTS CONSOLE ABO/RH: RH Type: POSITIVE

## 2015-06-26 LAB — OB RESULTS CONSOLE HEPATITIS B SURFACE ANTIGEN: Hepatitis B Surface Ag: NEGATIVE

## 2015-06-26 LAB — OB RESULTS CONSOLE HIV ANTIBODY (ROUTINE TESTING): HIV: NONREACTIVE

## 2015-06-26 LAB — OB RESULTS CONSOLE RPR: RPR: NONREACTIVE

## 2015-06-26 LAB — OB RESULTS CONSOLE GC/CHLAMYDIA
Chlamydia: NEGATIVE
GC PROBE AMP, GENITAL: NEGATIVE

## 2015-06-26 LAB — OB RESULTS CONSOLE RUBELLA ANTIBODY, IGM: Rubella: IMMUNE

## 2015-07-26 NOTE — L&D Delivery Note (Signed)
Operative Delivery Note At 6:21 PM a viable female was delivered via Vaginal, [employer], operational(Extractor).  Presentation: vertex; Position: Right,, Occiput,, Anterior; Station: +2.  Verbal consent: obtained from patient.  Risks and benefits discussed in detail.  Risks include, but are not limited to the risks of anesthesia, bleeding, infection, damage to maternal tissues, fetal cephalhematoma.  There is also the risk of inability to effect vaginal delivery of the head, or shoulder dystocia that cannot be resolved by established maneuvers, leading to the need for emergency cesarean section.  APGAR:9 9 weight pending   Placenta status spontaneously delivered with short umbilical cord: , .   Cord:  with the following complications:none .  Cord pH: not obtained  Anesthesia:  epidural Instruments: mushroom with 1 pull no popoff Episiotomy: none  Lacerations:  none Suture Repair: not applicable Est. Blood Loss (mL):  300  Mom to postpartum.  Baby to Couplet care / Skin to Skin.  Maykayla Highley L 01/26/2016, 6:30 PM

## 2015-09-15 ENCOUNTER — Telehealth: Payer: Self-pay | Admitting: Family Medicine

## 2015-09-15 NOTE — Telephone Encounter (Signed)
LM for pt to call and schedule flu shot or update records & and cpe (last saw Dr. Beverely Low 09/2013)

## 2015-09-16 ENCOUNTER — Telehealth: Payer: Self-pay | Admitting: Family Medicine

## 2015-09-16 NOTE — Telephone Encounter (Signed)
Patient had Flu Shot in October 2016 @ [facility]. Also states that she is currently pregnant thus she is seeing her OB/GYN instead of her PCP.

## 2015-09-16 NOTE — Telephone Encounter (Signed)
Chart updated to reflect flu shot, sent to PCP in regards to pregnancy.

## 2016-01-26 ENCOUNTER — Inpatient Hospital Stay (HOSPITAL_COMMUNITY)
Admission: AD | Admit: 2016-01-26 | Discharge: 2016-01-28 | DRG: 775 | Disposition: A | Discharge status: Home / Self Care | Payer: Managed Care, Other (non HMO) | Source: Ambulatory Visit | Attending: Obstetrics and Gynecology | Admitting: Obstetrics and Gynecology

## 2016-01-26 ENCOUNTER — Inpatient Hospital Stay (HOSPITAL_COMMUNITY): Payer: Managed Care, Other (non HMO) | Admitting: Anesthesiology

## 2016-01-26 ENCOUNTER — Encounter (HOSPITAL_COMMUNITY): Payer: Self-pay | Admitting: *Deleted

## 2016-01-26 DIAGNOSIS — Z833 Family history of diabetes mellitus: Secondary | ICD-10-CM | POA: Diagnosis not present

## 2016-01-26 DIAGNOSIS — O163 Unspecified maternal hypertension, third trimester: Secondary | ICD-10-CM

## 2016-01-26 DIAGNOSIS — Z3A39 39 weeks gestation of pregnancy: Secondary | ICD-10-CM | POA: Diagnosis not present

## 2016-01-26 DIAGNOSIS — Z8261 Family history of arthritis: Secondary | ICD-10-CM | POA: Diagnosis not present

## 2016-01-26 DIAGNOSIS — IMO0001 Reserved for inherently not codable concepts without codable children: Secondary | ICD-10-CM

## 2016-01-26 DIAGNOSIS — Z8249 Family history of ischemic heart disease and other diseases of the circulatory system: Secondary | ICD-10-CM

## 2016-01-26 LAB — TYPE AND SCREEN
ABO/RH(D): B POS
Antibody Screen: NEGATIVE

## 2016-01-26 LAB — COMPREHENSIVE METABOLIC PANEL
ALT: 12 U/L — ABNORMAL LOW (ref 14–54)
ANION GAP: 10 (ref 5–15)
AST: 23 U/L (ref 15–41)
Albumin: 3.2 g/dL — ABNORMAL LOW (ref 3.5–5.0)
Alkaline Phosphatase: 149 U/L — ABNORMAL HIGH (ref 38–126)
BILIRUBIN TOTAL: 0.4 mg/dL (ref 0.3–1.2)
BUN: 12 mg/dL (ref 6–20)
CO2: 20 mmol/L — ABNORMAL LOW (ref 22–32)
Calcium: 9.2 mg/dL (ref 8.9–10.3)
Chloride: 104 mmol/L (ref 101–111)
Creatinine, Ser: 0.74 mg/dL (ref 0.44–1.00)
Glucose, Bld: 85 mg/dL (ref 65–99)
POTASSIUM: 3.9 mmol/L (ref 3.5–5.1)
Sodium: 134 mmol/L — ABNORMAL LOW (ref 135–145)
TOTAL PROTEIN: 6.1 g/dL — AB (ref 6.5–8.1)

## 2016-01-26 LAB — URINALYSIS, ROUTINE W REFLEX MICROSCOPIC
Bilirubin Urine: NEGATIVE
GLUCOSE, UA: NEGATIVE mg/dL
KETONES UR: NEGATIVE mg/dL
LEUKOCYTES UA: NEGATIVE
Nitrite: NEGATIVE
PROTEIN: NEGATIVE mg/dL
Specific Gravity, Urine: 1.02 (ref 1.005–1.030)
pH: 5.5 (ref 5.0–8.0)

## 2016-01-26 LAB — URINE MICROSCOPIC-ADD ON

## 2016-01-26 LAB — LACTATE DEHYDROGENASE: LDH: 154 U/L (ref 98–192)

## 2016-01-26 LAB — RAPID HIV SCREEN (HIV 1/2 AB+AG)
HIV 1/2 ANTIBODIES: NONREACTIVE
HIV-1 P24 Antigen - HIV24: NONREACTIVE

## 2016-01-26 LAB — CBC
HEMATOCRIT: 35.4 % — AB (ref 36.0–46.0)
Hemoglobin: 12.5 g/dL (ref 12.0–15.0)
MCH: 31.8 pg (ref 26.0–34.0)
MCHC: 35.3 g/dL (ref 30.0–36.0)
MCV: 90.1 fL (ref 78.0–100.0)
Platelets: 165 10*3/uL (ref 150–400)
RBC: 3.93 MIL/uL (ref 3.87–5.11)
RDW: 13.8 % (ref 11.5–15.5)
WBC: 6.5 10*3/uL (ref 4.0–10.5)

## 2016-01-26 LAB — ABO/RH: ABO/RH(D): B POS

## 2016-01-26 LAB — PROTEIN / CREATININE RATIO, URINE
CREATININE, URINE: 130 mg/dL
PROTEIN CREATININE RATIO: 0.13 mg/mg{creat} (ref 0.00–0.15)
TOTAL PROTEIN, URINE: 17 mg/dL

## 2016-01-26 MED ORDER — DIPHENHYDRAMINE HCL 50 MG/ML IJ SOLN: 12.5000 mg | INTRAMUSCULAR | Status: DC | PRN

## 2016-01-26 MED ORDER — DIPHENHYDRAMINE HCL 25 MG PO CAPS: 25.0000 mg | ORAL_CAPSULE | Freq: Four times a day (QID) | ORAL | Status: DC | PRN

## 2016-01-26 MED ORDER — VALACYCLOVIR HCL 500 MG PO TABS
1000.0000 mg | ORAL_TABLET | Freq: Every day | ORAL | Status: DC
  Administered 2016-01-26 – 2016-01-27 (×2): 1000 mg via ORAL
  Filled 2016-01-26 (×2): qty 2

## 2016-01-26 MED ORDER — LACTATED RINGERS IV SOLN
500.0000 mL | INTRAVENOUS | Status: DC | PRN
  Administered 2016-01-26: 1000 mL via INTRAVENOUS

## 2016-01-26 MED ORDER — TETANUS-DIPHTH-ACELL PERTUSSIS 5-2.5-18.5 LF-MCG/0.5 IM SUSP: 0.5000 mL | Freq: Once | INTRAMUSCULAR | Status: DC

## 2016-01-26 MED ORDER — LIDOCAINE HCL (PF) 1 % IJ SOLN
30.0000 mL | INTRAMUSCULAR | Status: DC | PRN
  Filled 2016-01-26: qty 30

## 2016-01-26 MED ORDER — BENZOCAINE-MENTHOL 20-0.5 % EX AERO
1.0000 "application " | INHALATION_SPRAY | CUTANEOUS | Status: DC | PRN
  Administered 2016-01-26: 1 via TOPICAL
  Filled 2016-01-26: qty 56

## 2016-01-26 MED ORDER — MEDROXYPROGESTERONE ACETATE 150 MG/ML IM SUSP: 150.0000 mg | INTRAMUSCULAR | Status: DC | PRN

## 2016-01-26 MED ORDER — FLEET ENEMA 7-19 GM/118ML RE ENEM: 1.0000 | ENEMA | Freq: Every day | RECTAL | Status: DC | PRN

## 2016-01-26 MED ORDER — ONDANSETRON HCL 4 MG/2ML IJ SOLN: 4.0000 mg | Freq: Four times a day (QID) | INTRAMUSCULAR | Status: DC | PRN

## 2016-01-26 MED ORDER — OXYCODONE-ACETAMINOPHEN 5-325 MG PO TABS: 1.0000 | ORAL_TABLET | ORAL | Status: DC | PRN

## 2016-01-26 MED ORDER — WITCH HAZEL-GLYCERIN EX PADS: 1.0000 "application " | MEDICATED_PAD | CUTANEOUS | Status: DC | PRN

## 2016-01-26 MED ORDER — OXYCODONE-ACETAMINOPHEN 5-325 MG PO TABS: 2.0000 | ORAL_TABLET | ORAL | Status: DC | PRN

## 2016-01-26 MED ORDER — OXYTOCIN BOLUS FROM INFUSION: 500.0000 mL | INTRAVENOUS | Status: DC

## 2016-01-26 MED ORDER — DIBUCAINE 1 % RE OINT: 1.0000 "application " | TOPICAL_OINTMENT | RECTAL | Status: DC | PRN

## 2016-01-26 MED ORDER — LACTATED RINGERS IV SOLN: INTRAVENOUS | Status: DC

## 2016-01-26 MED ORDER — PHENYLEPHRINE 40 MCG/ML (10ML) SYRINGE FOR IV PUSH (FOR BLOOD PRESSURE SUPPORT)
PREFILLED_SYRINGE | INTRAVENOUS | Status: AC
  Filled 2016-01-26: qty 20

## 2016-01-26 MED ORDER — COCONUT OIL OIL
1.0000 "application " | TOPICAL_OIL | Status: DC | PRN
  Administered 2016-01-27: 1 via TOPICAL
  Filled 2016-01-26: qty 120

## 2016-01-26 MED ORDER — EPHEDRINE 5 MG/ML INJ
10.0000 mg | INTRAVENOUS | Status: DC | PRN
  Filled 2016-01-26: qty 2

## 2016-01-26 MED ORDER — MEASLES, MUMPS & RUBELLA VAC ~~LOC~~ INJ
0.5000 mL | INJECTION | Freq: Once | SUBCUTANEOUS | Status: DC
  Filled 2016-01-26: qty 0.5

## 2016-01-26 MED ORDER — SOD CITRATE-CITRIC ACID 500-334 MG/5ML PO SOLN: 30.0000 mL | ORAL | Status: DC | PRN

## 2016-01-26 MED ORDER — SIMETHICONE 80 MG PO CHEW: 80.0000 mg | CHEWABLE_TABLET | ORAL | Status: DC | PRN

## 2016-01-26 MED ORDER — ZOLPIDEM TARTRATE 5 MG PO TABS: 5.0000 mg | ORAL_TABLET | Freq: Every evening | ORAL | Status: DC | PRN

## 2016-01-26 MED ORDER — IBUPROFEN 600 MG PO TABS
600.0000 mg | ORAL_TABLET | Freq: Four times a day (QID) | ORAL | Status: DC
  Administered 2016-01-26 – 2016-01-28 (×6): 600 mg via ORAL
  Filled 2016-01-26 (×6): qty 1

## 2016-01-26 MED ORDER — BISACODYL 10 MG RE SUPP
10.0000 mg | Freq: Every day | RECTAL | Status: DC | PRN
Start: 2016-01-26 — End: 2016-01-28

## 2016-01-26 MED ORDER — ACETAMINOPHEN 325 MG PO TABS
650.0000 mg | ORAL_TABLET | ORAL | Status: DC | PRN
  Filled 2016-01-26 (×3): qty 2

## 2016-01-26 MED ORDER — PHENYLEPHRINE 40 MCG/ML (10ML) SYRINGE FOR IV PUSH (FOR BLOOD PRESSURE SUPPORT)
80.0000 ug | PREFILLED_SYRINGE | INTRAVENOUS | Status: DC | PRN
  Filled 2016-01-26: qty 5

## 2016-01-26 MED ORDER — FENTANYL 2.5 MCG/ML BUPIVACAINE 1/10 % EPIDURAL INFUSION (WH - ANES)
14.0000 mL/h | INTRAMUSCULAR | Status: DC | PRN
  Administered 2016-01-26: 14 mL/h via EPIDURAL

## 2016-01-26 MED ORDER — ACETAMINOPHEN 325 MG PO TABS
650.0000 mg | ORAL_TABLET | ORAL | Status: DC | PRN
  Administered 2016-01-26 – 2016-01-27 (×3): 650 mg via ORAL

## 2016-01-26 MED ORDER — ONDANSETRON HCL 4 MG PO TABS: 4.0000 mg | ORAL_TABLET | ORAL | Status: DC | PRN

## 2016-01-26 MED ORDER — FENTANYL 2.5 MCG/ML BUPIVACAINE 1/10 % EPIDURAL INFUSION (WH - ANES)
14.0000 mL/h | INTRAMUSCULAR | Status: DC | PRN
  Administered 2016-01-26: 14 mL/h via EPIDURAL
  Filled 2016-01-26: qty 125

## 2016-01-26 MED ORDER — OXYTOCIN 40 UNITS IN LACTATED RINGERS INFUSION - SIMPLE MED
2.5000 [IU]/h | INTRAVENOUS | Status: DC
  Filled 2016-01-26: qty 1000

## 2016-01-26 MED ORDER — PRENATAL MULTIVITAMIN CH
1.0000 | ORAL_TABLET | Freq: Every day | ORAL | Status: DC
  Administered 2016-01-27: 1 via ORAL
  Filled 2016-01-26: qty 1

## 2016-01-26 MED ORDER — ONDANSETRON HCL 4 MG/2ML IJ SOLN: 4.0000 mg | INTRAMUSCULAR | Status: DC | PRN

## 2016-01-26 MED ORDER — LACTATED RINGERS IV SOLN: 500.0000 mL | Freq: Once | INTRAVENOUS | Status: DC

## 2016-01-26 MED ORDER — LIDOCAINE HCL (PF) 1 % IJ SOLN
INTRAMUSCULAR | Status: DC | PRN
  Administered 2016-01-26 (×2): 4 mL via EPIDURAL

## 2016-01-26 MED ORDER — SENNOSIDES-DOCUSATE SODIUM 8.6-50 MG PO TABS
2.0000 | ORAL_TABLET | ORAL | Status: DC
  Administered 2016-01-26 – 2016-01-28 (×2): 2 via ORAL
  Filled 2016-01-26 (×2): qty 2

## 2016-01-26 NOTE — Progress Notes (Signed)
Pt to [facility] and now on L side upon return

## 2016-01-26 NOTE — MAU Provider Note (Signed)
Chief Complaint:  Contractions   First Provider Initiated Contact with Patient 01/26/16 1056     HPI: Alexandra Bradley is a 35 y.o. G2P0001 at 1877w5d who presents to maternity admissions reporting contractions. Was found to have elevated BP. Denies headache, vision changes, epigastric pain.  Location: Suprapubic Quality: Cramping Severity: 5/10 in pain scale Timing: Intermittent Associated signs and symptoms: Negative for leaking of fluid or vaginal bleeding. Positive fetal movement.  Pregnancy Course: Uncomplicated. Denies any elevated blood pressures at prenatal visits. Records reviewed. No hypertension as of last appointment at 36 weeks available under media tab.  Past Medical History: Past Medical History  Diagnosis Date  . Chicken pox     Past obstetric history: OB History  Gravida Para Term Preterm AB SAB TAB Ectopic Multiple Living  2 1 0       1    # Outcome Date GA Lbr Len/2nd Weight Sex Delivery Anes PTL Lv  2 Current           1 Para 02/03/12    F Vag-Spont EPI  Y      Past Surgical History: Past Surgical History  Procedure Laterality Date  . Breast biopsy  2005 and 2006     Family History: Family History  Problem Relation Age of Onset  . Arthritis Mother   . Hyperlipidemia Mother   . Hypertension Mother   . Arthritis Father   . Hyperlipidemia Father   . Hypertension Father   . Diabetes Father   . Cancer Sister     breast  . Hyperlipidemia Sister   . Hyperlipidemia Brother   . Arthritis Maternal Grandmother     Social History: Social History  Substance Use Topics  . Smoking status: Never Smoker   . Smokeless tobacco: None  . Alcohol Use: No    Allergies: No Known Allergies  Meds:  Prescriptions prior to admission  Medication Sig Dispense Refill Last Dose  . Prenatal Vit-Fe Fumarate-FA (PRENATAL MULTIVITAMIN) TABS tablet Take 1 tablet by mouth daily at 12 noon.   01/25/2016 at Unknown time  . valACYclovir (VALTREX) 1000 MG tablet Take 1,000 mg  by mouth daily.   01/25/2016 at Unknown time    I have reviewed patient's Past Medical Hx, Surgical Hx, Family Hx, Social Hx, medications and allergies.   ROS:  Review of Systems  Constitutional: Negative for fever and chills.  Eyes: Negative for visual disturbance.  Gastrointestinal: Positive for abdominal pain.  Genitourinary: Negative for vaginal bleeding and vaginal discharge. Difficulty urinating: contractions only.  Neurological: Negative for headaches.    Physical Exam  Patient Vitals for the past 24 hrs:  BP Temp Temp src Pulse Resp SpO2 Height Weight  01/26/16 1131 122/71 mmHg - - 80 - - - -  01/26/16 1120 119/75 mmHg - - 79 - - - -  01/26/16 1100 130/78 mmHg - - 84 - - - -  01/26/16 1045 132/73 mmHg - - 80 - - - -  01/26/16 1030 134/83 mmHg - - 82 - - - -  01/26/16 1015 125/80 mmHg - - - - - - -  01/26/16 1000 123/96 mmHg - - 85 - - - -  01/26/16 0945 132/83 mmHg - - 90 - - - -  01/26/16 0933 - 97.7 F (36.5 C) - - - - - 174 lb (78.926 kg)  01/26/16 0932 140/85 mmHg - - 90 - - - -  01/26/16 0931 140/85 mmHg - - 83 18 - - -  Constitutional: Well-developed, well-nourished female in no acute distress.  Cardiovascular: normal rate Respiratory: normal effort GI: Gravid appropriate for gestational age. MS: Extremities nontender, no edema, normal ROM Neurologic: Alert and oriented x 4.  GU: Dilation: 4 cm per RN  FHT:  Reactive Toco: Q4-6, moderate  Labs: Results for orders placed or performed during the hospital encounter of 01/26/16 (from the past 24 hour(s))  Protein / creatinine ratio, urine     Status: None   Collection Time: 01/26/16  9:15 AM  Result Value Ref Range   Creatinine, Urine 130.00 mg/dL   Total Protein, Urine 17 mg/dL   Protein Creatinine Ratio 0.13 0.00 - 0.15 mg/mg[Cre]  Urinalysis, Routine w reflex microscopic (not at [facility])     Status: Abnormal   Collection Time: 01/26/16  9:15 AM  Result Value Ref Range   Color, Urine YELLOW YELLOW    APPearance CLEAR CLEAR   Specific Gravity, Urine 1.020 1.005 - 1.030   pH 5.5 5.0 - 8.0   Glucose, UA NEGATIVE NEGATIVE mg/dL   Hgb urine dipstick TRACE (A) NEGATIVE   Bilirubin Urine NEGATIVE NEGATIVE   Ketones, ur NEGATIVE NEGATIVE mg/dL   Protein, ur NEGATIVE NEGATIVE mg/dL   Nitrite NEGATIVE NEGATIVE   Leukocytes, UA NEGATIVE NEGATIVE  Urine microscopic-add on     Status: Abnormal   Collection Time: 01/26/16  9:15 AM  Result Value Ref Range   Squamous Epithelial / LPF 6-30 (A) NONE SEEN   WBC, UA 0-5 0 - 5 WBC/hpf   RBC / HPF 0-5 0 - 5 RBC/hpf   Bacteria, UA MANY (A) NONE SEEN  CBC     Status: Abnormal   Collection Time: 01/26/16 10:32 AM  Result Value Ref Range   WBC 6.5 4.0 - 10.5 K/uL   RBC 3.93 3.87 - 5.11 MIL/uL   Hemoglobin 12.5 12.0 - 15.0 g/dL   HCT 04.535.4 (L) 40.936.0 - 81.146.0 %   MCV 90.1 78.0 - 100.0 fL   MCH 31.8 26.0 - 34.0 pg   MCHC 35.3 30.0 - 36.0 g/dL   RDW 91.413.8 78.211.5 - 95.615.5 %   Platelets 165 150 - 400 K/uL  Comprehensive metabolic panel     Status: Abnormal   Collection Time: 01/26/16 10:32 AM  Result Value Ref Range   Sodium 134 (L) 135 - 145 mmol/L   Potassium 3.9 3.5 - 5.1 mmol/L   Chloride 104 101 - 111 mmol/L   CO2 20 (L) 22 - 32 mmol/L   Glucose, Bld 85 65 - 99 mg/dL   BUN 12 6 - 20 mg/dL   Creatinine, Ser 2.130.74 0.44 - 1.00 mg/dL   Calcium 9.2 8.9 - 08.610.3 mg/dL   Total Protein 6.1 (L) 6.5 - 8.1 g/dL   Albumin 3.2 (L) 3.5 - 5.0 g/dL   AST 23 15 - 41 U/L   ALT 12 (L) 14 - 54 U/L   Alkaline Phosphatase 149 (H) 38 - 126 U/L   Total Bilirubin 0.4 0.3 - 1.2 mg/dL   GFR calc non Af Amer >60 >60 mL/min   GFR calc Af Amer >60 >60 mL/min   Anion gap 10 5 - 15  Lactate dehydrogenase     Status: None   Collection Time: 01/26/16 10:32 AM  Result Value Ref Range   LDH 154 98 - 192 U/L  Type and screen [facility]     Status: None   Collection Time: 01/26/16  1:20 PM  Result Value Ref Range  ABO/RH(D) B POS    Antibody Screen NEG     Sample Expiration 01/29/2016    MAU Course: Cycle blood pressures, CBC, CMP, UA, protein creatinine ratio.  Discussed history, blood pressure, labs, cervical exam with Dr. Vincente Poli. Recommends checking cervix and 1 more hour to determine if patient needs to be admitted.  Patient dilated to 6.5 cm.  MDM: - Patient now in active labor. - Hypertension in pregnancy without evidence of preeclampsia.  Assessment: Hypertension in pregnancy Early labor  Plan: Admit to labor and delivery per Dr. Vincente Poli. Dr. Vincente Poli assuming care patient.  [place], [place] 01/26/2016 3:47 PM

## 2016-01-26 NOTE — MAU Note (Signed)
Urine in lab 

## 2016-01-26 NOTE — Progress Notes (Signed)
Pillow between legs

## 2016-01-26 NOTE — Anesthesia Preprocedure Evaluation (Signed)
Anesthesia Evaluation  Patient identified by MRN, date of birth, ID band Patient awake    Reviewed: Allergy & Precautions, Patient's Chart, lab work & pertinent test results  Airway Mallampati: II  TM Distance: >3 FB Neck ROM: Full    Dental  (+) Teeth Intact   Pulmonary neg pulmonary ROS,    Pulmonary exam normal breath sounds clear to auscultation       Cardiovascular negative cardio ROS Normal cardiovascular exam Rate:Normal     Neuro/Psych negative neurological ROS  negative psych ROS   GI/Hepatic negative GI ROS, Neg liver ROS,   Endo/Other  Obesity   Renal/GU negative Renal ROS  negative genitourinary   Musculoskeletal negative musculoskeletal ROS (+)   Abdominal (+) + obese,   Peds  Hematology negative hematology ROS (+)   Anesthesia Other Findings   Reproductive/Obstetrics (+) Pregnancy HSV                             Anesthesia Physical Anesthesia Plan  ASA: II  Anesthesia Plan: Epidural   Post-op Pain Management:    Induction:   Airway Management Planned: Natural Airway  Additional Equipment:   Intra-op Plan:   Post-operative Plan:   Informed Consent: I have reviewed the patients History and Physical, chart, labs and discussed the procedure including the risks, benefits and alternatives for the proposed anesthesia with the patient or authorized representative who has indicated his/her understanding and acceptance.     Plan Discussed with: Anesthesiologist  Anesthesia Plan Comments:         Anesthesia Quick Evaluation

## 2016-01-26 NOTE — Anesthesia Pain Management Evaluation Note (Signed)
  CRNA Pain Management Visit Note  Patient: Alexandra Bradley, 35 y.o., female  "Hello I am a member of the anesthesia team at [facility]. We have an anesthesia team available at all times to provide care throughout the hospital, including epidural management and anesthesia for C-section. I don't know your plan for the delivery whether it a natural birth, water birth, IV sedation, nitrous supplementation, doula or epidural, but we want to meet your pain goals."   1.Was your pain managed to your expectations on prior hospitalizations?   Yes   2.What is your expectation for pain management during this hospitalization?     Epidural  3.How can we help you reach that goal? Epidural  Record the patient's initial score and the patient's pain goal.   Pain: 4  Pain Goal: 4 [facility] wants you to be able to say your pain was always managed very well.  Cephus ShellingBURGER,Diandre Merica 01/26/2016

## 2016-01-26 NOTE — Anesthesia Procedure Notes (Signed)
Epidural Patient location during procedure: OB Start time: 01/26/2016 2:31 PM  Staffing Anesthesiologist: Mal AmabileFOSTER, Terre Zabriskie Performed by: anesthesiologist   Preanesthetic Checklist Completed: patient identified, site marked, surgical consent, pre-op evaluation, timeout performed, IV checked, risks and benefits discussed and monitors and equipment checked  Epidural Patient position: sitting Prep: site prepped and draped and DuraPrep Patient monitoring: continuous pulse ox and blood pressure Approach: midline Location: L3-L4 Injection technique: LOR air  Needle:  Needle type: Tuohy  Needle gauge: 17 G Needle length: 9 cm and 9 Needle insertion depth: 6 cm Catheter type: closed end flexible Catheter size: 19 Gauge Catheter at skin depth: 11 cm Test dose: negative and Other  Assessment Events: blood not aspirated, injection not painful, no injection resistance, negative IV test and no paresthesia  Additional Notes Patient identified. Risks and benefits discussed including failed block, incomplete  Pain control, post dural puncture headache, nerve damage, paralysis, blood pressure Changes, nausea, vomiting, reactions to medications-both toxic and allergic and post Partum back pain. All questions were answered. Patient expressed understanding and wished to proceed. Sterile technique was used throughout procedure. Epidural site was Dressed with sterile barrier dressing. No paresthesias, signs of intravascular injection Or signs of intrathecal spread were encountered.  Patient was more comfortable after the epidural was dosed. Please see RN's note for documentation of vital signs and FHR which are stable.

## 2016-01-26 NOTE — H&P (Signed)
Alexandra Bradley is a 35 y.o. G 1 P 0 at 39 w 5 days presents in active labor.  Maternal Medical History:  Reason for admission: Contractions.   Contractions: Onset was 3-5 hours ago.   Frequency: irregular.   Perceived severity is moderate.    Prenatal complications: no prenatal complications   OB History    Gravida Para Term Preterm AB TAB SAB Ectopic Multiple Living   2         1     Past Medical History  Diagnosis Date  . Chicken pox    Past Surgical History  Procedure Laterality Date  . Breast biopsy  2005 and 2006   Family History: family history includes Arthritis in her father, maternal grandmother, and mother; Cancer in her sister; Diabetes in her father; Hyperlipidemia in her brother, father, mother, and sister; Hypertension in her father and mother. Social History:  reports that she has never smoked. She does not have any smokeless tobacco history on file. She reports that she does not drink alcohol or use illicit drugs.   Prenatal Transfer Tool  Maternal Diabetes: No Genetic Screening: Normal Maternal Ultrasounds/Referrals: Normal Fetal Ultrasounds or other Referrals:  None Maternal Substance Abuse:  No Significant Maternal Medications:  None Significant Maternal Lab Results:  None Other Comments:  None  Review of Systems  All other systems reviewed and are negative.   Dilation: 6.5 Effacement (%): 90 Station: -1 Exam by:: Dr Vincente PoliGrewal Blood pressure 122/64, pulse 80, temperature 97.7 F (36.5 C), resp. rate 18, weight 78.926 kg (174 lb), last menstrual period 04/23/2015. Maternal Exam:  Abdomen: Fetal presentation: vertex  Introitus: Normal vulva.   Fetal Exam Fetal State Assessment: Category I - tracings are normal.     Physical Exam  Nursing note and vitals reviewed. Constitutional: She appears well-developed and well-nourished.  HENT:  Head: Normocephalic.  Eyes: Pupils are equal, round, and reactive to light.  Neck: Normal range of motion.   Cardiovascular: Normal rate and regular rhythm.   Respiratory: Effort normal.    Prenatal labs: ABO, Rh:   Antibody:   Rubella:   RPR:    HBsAg:    HIV:    GBS:     Assessment/Plan: IUP at 39 w 5 days Labor Admit Epidural AROM after epidural Anticipate NSVD  Alexandra Bradley 01/26/2016, 1:11 PM

## 2016-01-26 NOTE — MAU Note (Signed)
Pt presents to MAU with complaints of contractions that started around 7 this morning. Pt denies any vaginal bleeding or LOF

## 2016-01-26 NOTE — Progress Notes (Signed)
Dr Grewal in to see pt 

## 2016-01-27 LAB — CBC
HEMATOCRIT: 33.1 % — AB (ref 36.0–46.0)
Hemoglobin: 11.4 g/dL — ABNORMAL LOW (ref 12.0–15.0)
MCH: 31 pg (ref 26.0–34.0)
MCHC: 34.4 g/dL (ref 30.0–36.0)
MCV: 89.9 fL (ref 78.0–100.0)
PLATELETS: 159 10*3/uL (ref 150–400)
RBC: 3.68 MIL/uL — AB (ref 3.87–5.11)
RDW: 13.8 % (ref 11.5–15.5)
WBC: 10.1 10*3/uL (ref 4.0–10.5)

## 2016-01-27 LAB — RUBELLA SCREEN: RUBELLA: 1.44 {index} (ref >0.99)

## 2016-01-27 NOTE — Anesthesia Postprocedure Evaluation (Signed)
Anesthesia Post Note  Patient: Alexandra Bradley  Procedure(s) Performed: * No procedures listed *  Patient location during evaluation: Mother Baby Anesthesia Type: Epidural Level of consciousness: awake and alert and oriented Pain management: satisfactory to patient Vital Signs Assessment: post-procedure vital signs reviewed and stable Respiratory status: spontaneous breathing and nonlabored ventilation Cardiovascular status: stable Postop Assessment: no headache, no backache, no signs of nausea or vomiting, adequate PO intake and patient able to bend at knees (patient up walking) Anesthetic complications: no     Last Vitals:  Filed Vitals:   01/27/16 0129 01/27/16 0908  BP: 109/62 133/72  Pulse: 72 78  Temp: 37 C 36.4 C  Resp: 18 18    Last Pain:  Filed Vitals:   01/27/16 0925  PainSc: 0-No pain   Pain Goal: Patients Stated Pain Goal: 3 (01/26/16 1500)               [name]

## 2016-01-27 NOTE — Lactation Note (Signed)
This note was copied from a baby's chart. Lactation Consultation Note  Initial visit made.  Breastfeeding consultation services and support information given and reviewed.  This is mom's second baby.  She successfully breastfed her first baby.  Mom states baby is very eager at breast and sometimes bites nipple.  Encouraged mom to call out for breastfeeding assist when baby starts to cue.  Patient Name: Alexandra Bradley VOZDG'UToday's Date: 01/27/2016 Reason for consult: Initial assessment   Maternal Data Does the patient have breastfeeding experience prior to this delivery?: Yes  Feeding Feeding Type: Breast Fed Length of feed: 25 min  LATCH Score/Interventions                      Lactation Tools Discussed/Used     Consult Status Consult Status: Follow-up Date: 01/28/16 Follow-up type: In-patient    Huston FoleyMOULDEN, Oakes Mccready S 01/27/2016, 11:04 AM

## 2016-01-27 NOTE — Progress Notes (Signed)
Post Partum Day 1 Subjective: no complaints, up ad lib, voiding and tolerating PO  Objective: Blood pressure 109/62, pulse 72, temperature 98.6 F (37 C), temperature source Oral, resp. rate 18, height 5\' 2"  (1.575 m), weight 170 lb (77.111 kg), last menstrual period 04/23/2015, SpO2 99 %, unknown if currently breastfeeding.  Physical Exam:  General: cooperative Lochia: appropriate Uterine Fundus: firm Incision: healing well DVT Evaluation: No evidence of DVT seen on physical exam. Negative Homan's sign. No cords or calf tenderness. No significant calf/ankle edema.   Recent Labs  01/26/16 1032 01/27/16 0612  HGB 12.5 11.4*  HCT 35.4* 33.1*    Assessment/Plan: Plan for discharge tomorrow   LOS: 1 day   CURTIS,CAROL G 01/27/2016, 8:20 AM

## 2016-01-28 ENCOUNTER — Telehealth (HOSPITAL_COMMUNITY): Payer: Self-pay

## 2016-01-28 LAB — RPR: RPR: NONREACTIVE

## 2016-01-28 NOTE — Telephone Encounter (Signed)
Mom was discharged today after a good feeding with LC.  Mom is now complaining of left nipple pain, possible cracking at base of nipple, this is same breast as with latch assessment prior to discharge.  Mom is asking if she can pump she is scared to put baby back on breast.  LC advised mom to take latch break and pump 8X/24 hours to establish a good supply. LC encouraged hand expression after pumping.  Mom plans to continue to latch on right breast.  Mom reports 3-4 voids today and no stool since last pm.  Baby has weight check with peds in am.  LC advised mom to wake baby for feedings/ feed on demand and keep baby active for feedings as mom describes baby as sleepy during feedings.  Mom encouraged to give EBM back to baby in bottle as needed.  Mom did not use alternative feeding tools while in hospital.  LC encouraged mom to keep feeding record with voids and stools to keep up with feedings for accuracy.  Mom denies further questions at this time.

## 2016-01-28 NOTE — Discharge Summary (Signed)
Obstetric Discharge Summary Reason for Admission: onset of labor Prenatal Procedures: ultrasound Intrapartum Procedures: spontaneous vaginal delivery and vacuum Postpartum Procedures: none Complications-Operative and Postpartum: none HEMOGLOBIN  Date Value Ref Range Status  01/27/2016 11.4* 12.0 - 15.0 g/dL Final   HCT  Date Value Ref Range Status  01/27/2016 33.1* 36.0 - 46.0 % Final    Physical Exam:  General: alert and cooperative Lochia: appropriate Uterine Fundus: firm Incision: perineum intact DVT Evaluation: No evidence of DVT seen on physical exam. Negative Homan's sign. No cords or calf tenderness. No significant calf/ankle edema.  Discharge Diagnoses: Term Pregnancy-delivered  Discharge Information: Date: 01/28/2016 Activity: pelvic rest Diet: routine Medications: PNV and Ibuprofen Condition: stable Instructions: refer to practice specific booklet Discharge to: home   Newborn Data: Live born female  Birth Weight: 6 lb 8.4 oz (2960 g) APGAR: 9, 9  Home with mother.  CURTIS,CAROL G 01/28/2016, 9:39 AM

## 2016-01-28 NOTE — Lactation Note (Signed)
This note was copied from a baby's chart. Lactation Consultation Note  Patient Name: Alexandra Bradley Reason for consult: Follow-up assessment;Other (Comment) (2% weight loss , 6-6.3 oz , Bili at 31 hours 1.3 )  Baby is 40 hours old , and has been consistent at the breast. Per mom baby cluster fed all night.  LC recommended skin to skin feedings until the baby can stay awake for a feeding.  @ consult working on depth with mom with latch. Baby latched , multiply swallows noted, increased with breast compressions. Mother informed of post-discharge support and given phone number to the lactation department, including services for phone call assistance; out-patient appointments; and breastfeeding support group. List of other breastfeeding resources in the community given in the handout. Encouraged mother to call for problems or concerns related to breastfeeding. LC reviewed sore nipples and engorgement prevention and tx. Per mom has DEBP at home.  Due to sore nipples - LC instructed mom on the use shells, comfort gels.    Maternal Data Has patient been taught Hand Expression?: Yes  Feeding Feeding Type: Breast Fed Length of feed:  (baby feeding 10 mins and still feeding, swallows noted )  LATCH Score/Interventions Latch: Grasps breast easily, tongue down, lips flanged, rhythmical sucking. Intervention(s): Adjust position;Assist with latch;Breast massage;Breast compression  Audible Swallowing: Spontaneous and intermittent Intervention(s): Alternate breast massage  Type of Nipple: Everted at rest and after stimulation  Comfort (Breast/Nipple): Soft / non-tender     Hold (Positioning): Assistance needed to correctly position infant at breast and maintain latch. Intervention(s): Breastfeeding basics reviewed;Support Pillows;Position options;Skin to skin  LATCH Score: 9  Lactation Tools Discussed/Used Pump Review: Milk Storage Initiated by:: reviewed - MAI   Date initiated:: 01/28/16   Consult Status Consult Status: Complete Date: 01/28/16    Kathrin Greathouseorio, [name] [name], 11:11 AM

## 2016-01-29 ENCOUNTER — Telehealth (HOSPITAL_COMMUNITY): Payer: Self-pay | Admitting: Lactation Services

## 2016-01-29 NOTE — Telephone Encounter (Signed)
Baby is 594 days old. "Baby is high need". Baby is sleepy at the breast and mom does not think that she is getting enough while she is on because she will not stay awake. She offered formula bottle because she did not want to keep putting baby to breast. After offering the bottle she feels like her breast are full and she needs the baby to wake to feed but the baby is full.   Called yesterday because she has sore nipples. She was told to pump but when she pumps she only gets drops and still feels full. She has gone back to latching baby. She thinks the baby's latch is getting better but she still has nipple pain. She thinks that when she bf without help is when she was not getting help. She thinks she needs to stop latching so the nipples will heal. It hurts the most when baby first latches then feels a little better. When baby moves while bf it causes the pain to come back. She has ring around the base of nipple that is "broken". She is using coconut oil, comfort gels, and her milk on her nipple after feeding. She uses uses ice packs as needed.  Offered OP apt and mom was agreeable. Contact PCP about nipple cream.

## 2016-02-01 ENCOUNTER — Ambulatory Visit (HOSPITAL_COMMUNITY)
Admission: RE | Admit: 2016-02-01 | Discharge: 2016-02-01 | Disposition: A | Discharge status: Home / Self Care | Payer: Managed Care, Other (non HMO) | Source: Ambulatory Visit | Attending: Obstetrics & Gynecology | Admitting: Obstetrics & Gynecology

## 2016-02-01 NOTE — Lactation Note (Signed)
Lactation Consult  Mother's reason for visit: sore nipples, check latch  Visit Type:  Feeding assessment  Appointment Notes:   Sore nipples, pt . Confirmed appt. For today 7/10  Consult:  Initial Lactation Consultant:  Kathrin Greathouse  ________________________________________________________________________ Alexandra Bradley Name: Alexandra Bradley Date of Birth: 01/26/2014 Pediatrician: [facility] - DR. Summer  Gender: female Gestational Age: [redacted]w[redacted]d (At Birth) Birth Weight: 6 lb 8.4 oz (2960 g) Weight at Discharge: Weight: 6 lb 6.3 oz (2900 g)Date of Discharge: 01/28/2016 Filed Weights   01/26/16 1821 01/28/16 0739  Weight: 6 lb 8.4 oz (2960 g) 6 lb 6.3 oz (2900 g)   Last weight taken from location [facility]: 7/10 - 6-7 oz  Location:Pediatrician's office Weight today: 2904 g, 6-6.4oz   ______________________________________________________________________  Mother's Name: Alexandra Bradley Type of delivery:  Vaginal Delivery  Breastfeeding Experience:  2nd baby ( breast fed 1st 4 moths with issues ) and SN since the hospital  Maternal Medical Conditions:  Breast biopsy , not an issue per mom  Maternal Medications:  PNV, Valtrez   ________________________________________________________________________  Breastfeeding History (Post Discharge)  Frequency of breastfeeding:  On demand , and every 3-4 hours  Duration of feeding:  15 -45 mins  Supplementing:  occasionally with EBM in a bottle  Pumping - DEBP - Medela - once of twice a day 2 oz usually in the am   Infant Intake and Output Assessment  Voids:4-5  in 24 hrs.  Color:  Clear yellow Stools:  2  in 24 hrs.  Color:  Brown and Yellow  ________________________________________________________________________  Maternal Breast Assessment  Breast:  Full Nipple:  Erect Pain level:  4 on the left to start and intermittently back to 3 when tongue noise.  Pain interventions:   Expressed breast milk ( breast compressions with latch   _______________________________________________________________________ Feeding Assessment/Evaluation  Initial feeding assessment:  Infant's oral assessment:  Variance - LC suspects - short labial frenulum, upper lip stretches with exam,  And needed assist at the latch to flip open to flanged position. Also suspect a short posterior frenulum which  If causing mom sore ness, and cracking at the base for the nipple, and intermittent discomfort and for baby to intermittently  Reposition her mouth at the latch, besides intermittently becoming non - nutritive with latch.  During the feeding - intermittently LC noted rubbing / clicking noise and mom would feel a sharp pain from the lower portion of her nipple radiating Lower portion of her breast. From the out side latch was with depth and proper support / alignment of baby at the breast.  LC suspects a Lingual frenulum issue. LC recommended if following the [facility] plan provided didn't resolve discomfort with latch , Oral assessment by  An oral specialist would be indicated. Tongue - tie resource given at consult. LC stressed the importance of protecting establishing milk supply.    Positioning:  Football Left breast  LATCH documentation:  Latch:  2 = Grasps breast easily, tongue down, lips flanged, rhythmical sucking.  Audible swallowing:  2 = Spontaneous and intermittent  Type of nipple:  2 = Everted at rest and after stimulation  Comfort (Breast/Nipple):  1 = Filling, red/small blisters or bruises, mild/mod discomfort  Hold (Positioning):  1 = Assistance needed to correctly position infant at breast and maintain latch  LATCH score:  8   Attached assessment:  Shallow @ 1st , LC assisted to obtain depth  Lips flanged:  Yes.  Lips untucked:  Yes.    Suck assessment:  Nutritive and Nonnutritive  Tools:  None  Instructed on use and cleaning of tool:  No.  Pre-feed weight:  2904 g ,  6-6.4 oz  Post-feed weight:  2930 g , 6-7.4 oz  Amount transferred:  26 ml   Amount supplemented: none   Additional Feeding Assessment -   Infant's oral assessment:  Variance - see note above -   Positioning:  Football Right breast  LATCH documentation:  Latch:  1 = Repeated attempts needed to sustain latch, nipple held in mouth throughout feeding, stimulation needed to elicit sucking reflex.  Audible swallowing:  2 = Spontaneous and intermittent  Type of nipple:  2 = Everted at rest and after stimulation  Comfort (Breast/Nipple):  1 = Filling, red/small blisters or bruises, mild/mod discomfort  Hold (Positioning):  1 = Assistance needed to correctly position infant at breast and maintain latch  LATCH score: 7   Attached assessment:  Shallow @ 1st , assisted to obtain depth   Lips flanged:  No. - flipped to flanged position   Lips untucked:  Yes.    Suck assessment:  Nutritive  Tools:  Shells and hand pump ) mom has shells at home, hand pump at consult with instructions  Instructed on use and cleaning of tool:  No.  Pre-feed weight: 2930 g , 6-7.4 oz  Post-feed weight:  2952 g , 6-8.2 oz  Amount transferred:  22 ml  Amount supplemented:  None needed    Total amount pumped post feed:  No time to post pump   Total amount transferred:  48 ml  Total supplement given:  None   Lactation impression: -  Mom is having prolonged soreness with latch - cracked and sore nipples LC suspects Oral variance - please see note above for details, Resource for Tongue - tie given to mom  Baby transferred 48 ml at 6 days - acceptable for age , 14( 45- 60 ml should be average for this age to grow )  @ consult for this age - 6-6.4 oz  Per mention - due to the hoarse cry baby nay have largomalacia , or tracheomalacia and needs to be evaluated by ENT MD   Lactation plan of care:  Praised mom for her efforts breastfeeding  Sore nipple tx - EBM to the nipples liberally, alternate with breast  shells and comfort gels ( new set given at consult )  Steps for latching - breast massage, hand express, pre- pump if needed if to full  [place] needs to be compressible like a thin sandwich to enhance depth  Have dad help with latch if needed ( as shown at consult )  If you express of the 1st breast due to over fullness , offer 2nd breast at that feeding - always have baby feed long enough to soften 1st breast Growth spurts - 7 -10 days , 3 weeks , 6 weeks , cluster feeding normal

## 2016-02-02 ENCOUNTER — Inpatient Hospital Stay (HOSPITAL_COMMUNITY): Payer: Managed Care, Other (non HMO)

## 2016-02-04 ENCOUNTER — Encounter (HOSPITAL_COMMUNITY): Payer: Self-pay | Admitting: *Deleted

## 2016-02-15 ENCOUNTER — Telehealth (HOSPITAL_COMMUNITY): Payer: Self-pay | Admitting: Lactation Services

## 2016-02-15 NOTE — Telephone Encounter (Signed)
Mom called with questions about Lactation. Mom reports that infant will cue to feed and when mom latches her she will pull off and cry about 4-5 x a week. She does not feel her letdowns and is not sure if the infant is dealing with overactive letdowns. She reports infant does spit sometimes and that it is a good wet burp. Enc mom to burp infant more frequently and to report projectile vomiting, green vomitus, and frequent vomitus to Ped.   Mom reports that infant voids with each feeding, stools 6-7 x a day and was well over birth weight with last Ped appt. She reports infant softens breasts with feeding. She reports she is still feeding her about every three hours around the clock. She is awakening infant every 3 hours.  Enc her to fed infant on demand and allow her to cue to feed to get 8-12 feeds in 24 hours. Mom reports she will have some shorter feeds and will only feed 10 minutes, discussed with her that infant may become more efficient with nursing as she gets older and may not have to nurse as long for each feeding, and that feeding length variation is normal . Enc her to watch voids, stools, breast softening, swallows, infant satisfaction and regular awakening for feeding.   Mom is pumping about once or twice a day to store milk for later, reviewed supply and demand and supply regulating/down regulating to current demands.   Advised mom to call back with questions/concerns prn.

## 2016-03-03 ENCOUNTER — Telehealth (HOSPITAL_COMMUNITY): Payer: Self-pay

## 2016-03-03 NOTE — Telephone Encounter (Signed)
225 weeks old. Diagnosis of laryngomalacia Came for an outpatient in the first 1-2 weeks. ENT diagnosed with reflux and prescribed rinitidine but it is not helping. Encouraged her to burp the baby well to reduce stomach distention.  About 3-7 days ago mom noticed she was baby was popping on and off the breast and getting frustrated. Baby is "cluster feeding" at night and not sleeping as well. Other times she latches well. Suspect supply is decreasing.  Voids over 6 times Stools were after each feeding and now 3-4 a day. Eating 10-12 times a day.  Encouraged breast compression and post-pumping for 10 minutes after feeding and supplementing with breast milk if baby was not getting satisfied at the breast. Outpatient appointment scheduled for tomorrow.

## 2016-03-04 ENCOUNTER — Ambulatory Visit (HOSPITAL_COMMUNITY)
Admission: RE | Admit: 2016-03-04 | Discharge: 2016-03-04 | Disposition: A | Discharge status: Home / Self Care | Payer: Managed Care, Other (non HMO) | Source: Ambulatory Visit | Attending: Obstetrics & Gynecology | Admitting: Obstetrics & Gynecology

## 2016-03-04 NOTE — Lactation Note (Signed)
Lactation Consult  Mother's reason for visit: infant was fed at 2 taking 2 ounces Visit Type: Feeding assessment    Appointment Notes:   Mother states that the last several days baby is bouncing on and off. 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 Mother states that infant has been now diagnosed  with reflux.She states that she spitts up 5-6 times daily and she is gaggy.    Consult:  Initial Lactation Consultant:  Alexandra Bradley, Alexandra Bradley  ________________________________________________________________________    ________________________________________________________________________  Mother's Name: Alexandra Bradley Type of delivery:  vaginal del Breastfeeding Experience:  4 months Maternal Medical Conditions:  none Maternal Medications:  Prenatal vits , valtrex, fenugreek  ________________________________________________________________________  Breastfeeding History (Post Discharge)  Frequency of breastfeeding:  Every 2-3 hours Duration of feeding: 40-45 mins   Patient does not supplement or pump.  Infant Intake and Output Assessment  Voids:  6-8 in 24 hrs.  Color:  Clear yellow Stools:   2-3 in 24 hrs.  Color:  Yellow  ________________________________________________________________________  Maternal Breast Assessment  Breast:  Full Nipple:  Erect Pain level:  0 Pain interventions:  Bra    Initial feeding assessment Mother latched infant on the (R) breast in cross cradle hold. Infant latched well with good depth. Observed that lips are widely flanged. Infant has clicking sound the entire feeding.   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  Audible swallowing:  2 = Spontaneous and intermittent  Type of nipple:  2 = Everted at rest and after stimulation  Comfort (Breast/Nipple):  1 = Filling,  red/small blisters or bruises, mild/mod discomfort  Hold (Positioning):  2 = No assistance needed to correctly position infant at breast  LATCH score:  9  Attached assessment:  Deep  Lips flanged:  Yes.    Lips untucked:  Yes.    Suck assessment:  Displays both    Pre-feed weight: 4014 Post-feed weight:  4034 Amount transferred:20 ml       Additional Feeding Assessment - infant relatched to the alternate breast. Infant sustained latch for longer period. Observed that infant still click when suckles but no as bad .  Positioning:  Cross cradle Left breast  LATCH documentation:  Latch:  2 = Grasps breast easily, tongue down, lips flanged, rhythmical sucking.  Audible swallowing:  2 = Spontaneous and intermittent  Type of nipple:  2 = Everted at rest and after stimulation  Comfort (Breast/Nipple):  1 = Filling, red/small blisters or bruises, mild/mod discomfort  Hold (Positioning):  1 = Assistance needed to correctly position infant at breast and maintain latch  LATCH score: 8   Attached assessment:  Deep  Lips flanged:  Yes.    Lips untucked:  Yes.    Suck assessment:  Displays both    Pre-feed weight:  4034 Post-feed weight:  4062 Amount transferred:  28 ml Amount supplemented:  10 ml   Total amount transferred:  48 ml Total supplement given:  10 ml Mother to continue to breastfeed with feeding cues and at least every 2-3 hours Mother to continue to post pump 2-4 times daily Mother to try power pumping to increase supply Continue to take supplement fenugreek Follow up with PEDs for 2 month visit LC follow up PRN Suggested to mother to follow up with Specialist Alexandra Bradley for tongue tie

## 2016-03-10 ENCOUNTER — Other Ambulatory Visit: Payer: Self-pay | Admitting: Obstetrics & Gynecology

## 2016-03-10 DIAGNOSIS — N644 Mastodynia: Secondary | ICD-10-CM

## 2016-03-14 ENCOUNTER — Ambulatory Visit
Admission: RE | Admit: 2016-03-14 | Discharge: 2016-03-14 | Disposition: A | Discharge status: Home / Self Care | Payer: Managed Care, Other (non HMO) | Source: Ambulatory Visit | Attending: Obstetrics & Gynecology | Admitting: Obstetrics & Gynecology

## 2016-03-14 DIAGNOSIS — N644 Mastodynia: Secondary | ICD-10-CM

## 2016-04-05 ENCOUNTER — Telehealth (HOSPITAL_COMMUNITY): Payer: Self-pay | Admitting: Lactation Services

## 2016-04-05 NOTE — Telephone Encounter (Signed)
Mom called, left message and I called her back. Going back to work in 3 [employer] [place] and is concerned about pumping. Using DEBP from older baby and doesn't feel it hve good suction. Reports she can get more from manual pump. Suggested calling insurance company and seeing if she can get a new one for this baby or coming here and getting the suction checked on her old pump. Encouraged to call before she comes to make sure someone is here to help her. No further questions at this time, To call prn

## 2016-05-11 ENCOUNTER — Telehealth (HOSPITAL_COMMUNITY): Payer: Self-pay | Admitting: Lactation Services

## 2016-05-11 NOTE — Telephone Encounter (Signed)
Revia called wanting her suction strength of her Medela PIS checked.  Also wanting her flange size looked at.  Frustrated that she couldn't just come in, was told she needed to make an appointment.  LC offered for her to come in tomorrow, but she was off from work today.  Suggested she come to evening support group Monday at 7 pm.  Rogers SeedsCatrina has a few questions and wants someone to assess her flange fit while she is pumping.  Explained that it would be difficult to give her much individualized attention at [facility], other than checking pump strength and look at flange size when she is pumping.  Tried to assist her over the phone, but explained it would be important to observe her pumping.    Brieann decided to make an appointment for next Wednesday.

## 2016-05-18 ENCOUNTER — Ambulatory Visit (HOSPITAL_COMMUNITY)
Admission: RE | Admit: 2016-05-18 | Discharge: 2016-05-18 | Disposition: A | Discharge status: Home / Self Care | Payer: Managed Care, Other (non HMO) | Source: Ambulatory Visit | Attending: Obstetrics & Gynecology | Admitting: Obstetrics & Gynecology

## 2016-05-18 NOTE — Lactation Note (Signed)
Lactation Consult: Weight today: 11 lbs 10.6 oz  5290 g. Mom reports baby has been nursing well. Is having some trouble with pumping at work. Reports it takes 20-25 min each pumping session. Usually obtains about 3 oz. Baby is now drinking 4 oz per feeding at day care. Mom has tried Fenugreek, Fennel oil, Mothers milk tea and Liquid gold- lactation support. Reports she doesn't see much increase in milk supply. Has tried power pumping several times/day.. Mom using #27 flange- wants to try smaller one. Used #24 flange- mom reports no pain but it does look smaller. Was able to pump 4 oz at visit today in 25 min, Encouragement given. Suggested pumping under cover so as to not watch every drop, to relax and think about baby .I checked suction on her Medela pump and it has good suction.  No further questions at present  Mother's reason for visit:  Help with pumping and check flange size Appointment Notes:  Baby now 743 1/2 months old  Lactation Consultant:  Audry RilesWeeks, Arlethia Basso D  ________________________________________________________________________    ________________________________________________________________________  Mother's Name: Morton Petersatrina H Pullen   Breastfeeding Experience: P2 BF first baby for 4 months. Diff latch  ________________________________________________________________________

## 2016-08-18 LAB — HM PAP SMEAR

## 2016-12-15 ENCOUNTER — Telehealth: Payer: Self-pay | Admitting: *Deleted

## 2016-12-15 NOTE — Telephone Encounter (Signed)
Erroneous encounter

## 2017-03-22 ENCOUNTER — Encounter: Payer: Managed Care, Other (non HMO) | Admitting: Family Medicine

## 2017-04-24 ENCOUNTER — Other Ambulatory Visit: Payer: Self-pay | Admitting: Obstetrics & Gynecology

## 2017-04-24 DIAGNOSIS — Z1231 Encounter for screening mammogram for malignant neoplasm of breast: Secondary | ICD-10-CM

## 2017-04-28 ENCOUNTER — Encounter: Payer: Managed Care, Other (non HMO) | Admitting: Family Medicine

## 2017-05-10 ENCOUNTER — Ambulatory Visit: Payer: Managed Care, Other (non HMO)

## 2017-06-01 ENCOUNTER — Ambulatory Visit: Payer: Managed Care, Other (non HMO) | Admitting: Family Medicine

## 2017-06-09 ENCOUNTER — Ambulatory Visit
Admission: RE | Admit: 2017-06-09 | Discharge: 2017-06-09 | Disposition: A | Discharge status: Home / Self Care | Payer: Managed Care, Other (non HMO) | Source: Ambulatory Visit | Attending: Obstetrics & Gynecology | Admitting: Obstetrics & Gynecology

## 2017-06-09 DIAGNOSIS — Z1231 Encounter for screening mammogram for malignant neoplasm of breast: Secondary | ICD-10-CM

## 2017-06-09 LAB — HM MAMMOGRAPHY: HM Mammogram: NORMAL (ref 0–4)

## 2017-08-18 ENCOUNTER — Ambulatory Visit: Payer: Managed Care, Other (non HMO) | Admitting: Family Medicine

## 2017-08-18 ENCOUNTER — Encounter: Payer: Self-pay | Admitting: Family Medicine

## 2017-08-18 ENCOUNTER — Other Ambulatory Visit: Payer: Self-pay

## 2017-08-18 VITALS — BP 112/80 | HR 71 | Temp 98.0°F | Resp 16 | Ht 63.0 in | Wt 144.0 lb

## 2017-08-18 DIAGNOSIS — Z0000 Encounter for general adult medical examination without abnormal findings: Secondary | ICD-10-CM

## 2017-08-18 DIAGNOSIS — E559 Vitamin D deficiency, unspecified: Secondary | ICD-10-CM

## 2017-08-18 DIAGNOSIS — E785 Hyperlipidemia, unspecified: Secondary | ICD-10-CM

## 2017-08-18 NOTE — Assessment & Plan Note (Signed)
Pt's PE WNL.  UTD on immunizations and GYN.  Check labs.  Anticipatory guidance provided.  

## 2017-08-18 NOTE — Assessment & Plan Note (Signed)
Pt has hx of elevated cholesterol that she was able to reduce w/ healthy diet and regular exercise.  Check labs and determine if further tx is needed.

## 2017-08-18 NOTE — Assessment & Plan Note (Signed)
Pt has hx of Vit D deficiency.  Check labs.  Replete prn. 

## 2017-08-18 NOTE — Patient Instructions (Signed)
Schedule your lab visit at your convenience We'll notify you of your lab results and make any changes if needed Keep up the good work on healthy diet and regular exercise- you look great! Call with any questions or concerns Welcome Back!!!

## 2017-08-18 NOTE — Progress Notes (Signed)
   Subjective:    Patient ID: Alexandra Bradley, female    DOB: 05/06/1981, 37 y.o.   MRN: [id number]  HPI CPE- pt is concerned about high cholesterol.  UTD on pap.  UTD on flu and Tdap.   Review of Systems Patient reports no vision/ hearing changes, adenopathy,fever, weight change,  persistant/recurrent hoarseness , swallowing issues, chest pain, palpitations, edema, persistant/recurrent cough, hemoptysis, dyspnea (rest/exertional/paroxysmal nocturnal), gastrointestinal bleeding (melena, rectal bleeding), abdominal pain, significant heartburn, bowel changes, GU symptoms (dysuria, hematuria, incontinence), Gyn symptoms (abnormal  bleeding, pain),  syncope, focal weakness, memory loss, numbness & tingling, skin/hair/nail changes, abnormal bruising or bleeding, anxiety, or depression.     Objective:   Physical Exam General Appearance:    Alert, cooperative, no distress, appears stated age  Head:    Normocephalic, without obvious abnormality, atraumatic  Eyes:    PERRL, conjunctiva/corneas clear, EOM's intact, fundi    benign, both eyes  Ears:    Normal TM's and external ear canals, both ears  Nose:   Nares normal, septum midline, mucosa normal, no drainage    or sinus tenderness  Throat:   Lips, mucosa, and tongue normal; teeth and gums normal  Neck:   Supple, symmetrical, trachea midline, no adenopathy;    Thyroid: no enlargement/tenderness/nodules  Back:     Symmetric, no curvature, ROM normal, no CVA tenderness  Lungs:     Clear to auscultation bilaterally, respirations unlabored  Chest Wall:    No tenderness or deformity   Heart:    Regular rate and rhythm, S1 and S2 normal, no murmur, rub   or gallop  Breast Exam:    Deferred to GYN  Abdomen:     Soft, non-tender, bowel sounds active all four quadrants,    no masses, no organomegaly  Genitalia:    Deferred to GYN  Rectal:    Extremities:   Extremities normal, atraumatic, no cyanosis or edema  Pulses:   2+ and symmetric all  extremities  Skin:   Skin color, texture, turgor normal, no rashes or lesions  Lymph nodes:   Cervical, supraclavicular, and axillary nodes normal  Neurologic:   CNII-XII intact, normal strength, sensation and reflexes    throughout          Assessment & Plan:

## 2017-08-23 ENCOUNTER — Other Ambulatory Visit: Payer: Managed Care, Other (non HMO)

## 2017-08-24 ENCOUNTER — Other Ambulatory Visit: Payer: Managed Care, Other (non HMO)

## 2017-08-28 ENCOUNTER — Other Ambulatory Visit (INDEPENDENT_AMBULATORY_CARE_PROVIDER_SITE_OTHER): Payer: Managed Care, Other (non HMO)

## 2017-08-28 DIAGNOSIS — E559 Vitamin D deficiency, unspecified: Secondary | ICD-10-CM | POA: Diagnosis not present

## 2017-08-28 DIAGNOSIS — E785 Hyperlipidemia, unspecified: Secondary | ICD-10-CM | POA: Diagnosis not present

## 2017-08-28 LAB — VITAMIN D 25 HYDROXY (VIT D DEFICIENCY, FRACTURES): VITD: 13.34 ng/mL — ABNORMAL LOW (ref 30.00–100.00)

## 2017-08-28 LAB — BASIC METABOLIC PANEL
BUN: 17 mg/dL (ref 6–23)
CALCIUM: 9.3 mg/dL (ref 8.4–10.5)
CO2: 28 meq/L (ref 19–32)
Chloride: 105 mEq/L (ref 96–112)
Creatinine, Ser: 0.85 mg/dL (ref 0.40–1.20)
GFR: 98.15 mL/min (ref >60.00)
GLUCOSE: 82 mg/dL (ref 70–99)
Potassium: 4.5 mEq/L (ref 3.5–5.1)
Sodium: 138 mEq/L (ref 135–145)

## 2017-08-28 LAB — HEPATIC FUNCTION PANEL
ALT: 16 U/L (ref 0–35)
AST: 15 U/L (ref 0–37)
Albumin: 4.3 g/dL (ref 3.5–5.2)
Alkaline Phosphatase: 50 U/L (ref 39–117)
Bilirubin, Direct: 0.1 mg/dL (ref 0.0–0.3)
TOTAL PROTEIN: 6.9 g/dL (ref 6.0–8.3)
Total Bilirubin: 0.4 mg/dL (ref 0.2–1.2)

## 2017-08-28 LAB — CBC WITH DIFFERENTIAL/PLATELET
BASOS ABS: 0.1 10*3/uL (ref 0.0–0.1)
Basophils Relative: 1.2 % (ref 0.0–3.0)
EOS ABS: 0.1 10*3/uL (ref 0.0–0.7)
Eosinophils Relative: 2.2 % (ref 0.0–5.0)
HEMATOCRIT: 37.1 % (ref 36.0–46.0)
Hemoglobin: 12.4 g/dL (ref 12.0–15.0)
LYMPHS ABS: 1.9 10*3/uL (ref 0.7–4.0)
Lymphocytes Relative: 46.4 % — ABNORMAL HIGH (ref 12.0–46.0)
MCHC: 33.5 g/dL (ref 30.0–36.0)
MCV: 92 fl (ref 78.0–100.0)
MONOS PCT: 9 % (ref 3.0–12.0)
Monocytes Absolute: 0.4 10*3/uL (ref 0.1–1.0)
NEUTROS PCT: 41.2 % — AB (ref 43.0–77.0)
Neutro Abs: 1.7 10*3/uL (ref 1.4–7.7)
Platelets: 360 10*3/uL (ref 150.0–400.0)
RBC: 4.03 Mil/uL (ref 3.87–5.11)
RDW: 13.1 % (ref 11.5–15.5)
WBC: 4.2 10*3/uL (ref 4.0–10.5)

## 2017-08-28 LAB — LIPID PANEL
CHOLESTEROL: 186 mg/dL (ref 0–200)
HDL: 72.1 mg/dL (ref >39.00)
LDL CALC: 101 mg/dL — AB (ref 0–99)
NonHDL: 114.22
TRIGLYCERIDES: 66 mg/dL (ref 0.0–149.0)
Total CHOL/HDL Ratio: 3
VLDL: 13.2 mg/dL (ref 0.0–40.0)

## 2017-08-28 LAB — TSH: TSH: 0.71 u[IU]/mL (ref 0.35–4.50)

## 2017-08-29 ENCOUNTER — Other Ambulatory Visit: Payer: Self-pay | Admitting: General Practice

## 2017-08-29 ENCOUNTER — Encounter: Payer: Self-pay | Admitting: General Practice

## 2017-08-29 MED ORDER — VITAMIN D (ERGOCALCIFEROL) 1.25 MG (50000 UNIT) PO CAPS: 50000.0000 [IU] | ORAL_CAPSULE | ORAL | 0 refills | Status: DC

## 2017-10-09 ENCOUNTER — Telehealth: Payer: Self-pay | Admitting: *Deleted

## 2017-10-09 MED ORDER — ATOVAQUONE-PROGUANIL HCL 250-100 MG PO TABS: ORAL_TABLET | ORAL | 0 refills | Status: DC

## 2017-10-09 NOTE — Telephone Encounter (Signed)
Patient is going to [place] on [place] and will be there until 11-15-17.  She states that there is usually a medication that she has to take before she goes and while she is there. She is wondering if this can be called in for her.   Routing to PCP  Copied from CRM [id number]. Topic: General - Other >> Oct 09, 2017  8:03 AM Gerrianne ScalePayne, Angela L wrote: Reason for CRM: patient calling to get antibiotics she is traveling out of the country next month on 11-10-17 [facility] [address]

## 2017-10-09 NOTE — Telephone Encounter (Signed)
Medication has been sent to pharmacy. Patient is aware and she is aware of instructions.

## 2017-10-09 NOTE — Addendum Note (Signed)
Addended by: Lenis DickinsonILLARD, Gwendalynn Eckstrom M on: 10/09/2017 11:07 AM   Modules accepted: Orders

## 2017-10-09 NOTE — Telephone Encounter (Signed)
Ok for Malarone 1 tab daily starting 1-2 days before travel and extending 7 days after return #15, no refills

## 2017-10-09 NOTE — Telephone Encounter (Signed)
Patient states she is wanting the malaria medication.

## 2017-10-09 NOTE — Telephone Encounter (Signed)
Is pt asking for malaria medication or abx?  Typically the abx are only in the setting of Traveler's Diarrhea and only taken as needed for symptoms

## 2017-10-12 NOTE — Telephone Encounter (Addendum)
Relation to pt: self  Call back number: [phone] [facility] [place] [place] [address]  Reason for call:  Patient states PA required for atovaquone-proguanil (MALARONE) 250-100 MG TABS tablet. Patient states PA was faxed over 3x to [phone] and would like to know the status, please advise

## 2017-10-16 NOTE — Telephone Encounter (Signed)
Spoke with patient and she is going to contact her insurance company and see if there is a medication that they will cover.  If so, she will call me back to see if we can call one of those in.

## 2017-10-16 NOTE — Telephone Encounter (Signed)
Yes. Pt will have to pay out of pocket. Tabori just gave it to me today.

## 2017-10-19 ENCOUNTER — Telehealth: Payer: Self-pay | Admitting: Family Medicine

## 2017-10-19 MED ORDER — TYPHOID VACCINE PO CPDR: DELAYED_RELEASE_CAPSULE | ORAL | 0 refills | Status: DC

## 2017-10-19 MED ORDER — HEPATITIS A VACCINE 1440 EL U/ML IM SUSP: 1.0000 mL | Freq: Once | INTRAMUSCULAR | 0 refills | Status: AC

## 2017-10-19 NOTE — Telephone Encounter (Signed)
Copied from CRM [phone]. Topic: Inquiry >> Oct 19, 2017 12:29 PM Windy KalataMichael, Haydan Wedig L, NT wrote: Patient states she is going out of town in April she is requesting these, please advise.   Hep A  Vivotiof   [facility] [address] - [place] [address]  - [address] [place] [address] Phone: [phone] Fax: [phone]

## 2017-10-19 NOTE — Telephone Encounter (Signed)
Ok to send in.  

## 2017-10-19 NOTE — Telephone Encounter (Signed)
Called to inform pt that medication was sent to the pharmacy. Also sent in Rx for Hep A prescription as requested.. Pt states that her pharmacy will give her the immunization.

## 2017-10-19 NOTE — Addendum Note (Signed)
Addended by: Geannie RisenBRODMERKEL, JESSICA L on: 10/19/2017 04:12 PM   Modules accepted: Orders

## 2017-10-19 NOTE — Telephone Encounter (Signed)
Ok for Avon ProductsVivotif- 1 cap 1 hr before meal every other day x4 doses to complete at least 1 week before travel Will need OV for Hep A vaccine

## 2017-10-23 NOTE — Telephone Encounter (Signed)
Pharmacy is on back order for Hep A injection, ok to have done in office? Call back [phone]

## 2017-10-23 NOTE — Telephone Encounter (Signed)
We do. Per PCP ok for nurse visit

## 2017-10-23 NOTE — Telephone Encounter (Signed)
Patient has been scheduled for appointment. 

## 2017-10-26 ENCOUNTER — Ambulatory Visit (INDEPENDENT_AMBULATORY_CARE_PROVIDER_SITE_OTHER): Payer: Managed Care, Other (non HMO) | Admitting: Family Medicine

## 2017-10-26 DIAGNOSIS — Z23 Encounter for immunization: Secondary | ICD-10-CM | POA: Diagnosis not present

## 2017-10-26 NOTE — Progress Notes (Signed)
Patient received Hep A vaccine today. Patient stated that she is going on vacation to the [place].  Above order was mine.  Neena RhymesKatherine Tabori, MD

## 2019-03-27 ENCOUNTER — Other Ambulatory Visit: Payer: Self-pay | Admitting: Emergency Medicine

## 2019-03-27 DIAGNOSIS — Z20822 Contact with and (suspected) exposure to covid-19: Secondary | ICD-10-CM

## 2019-03-28 LAB — NOVEL CORONAVIRUS, NAA: SARS-CoV-2, NAA: NOT DETECTED

## 2019-06-14 ENCOUNTER — Ambulatory Visit (INDEPENDENT_AMBULATORY_CARE_PROVIDER_SITE_OTHER): Payer: BC Managed Care – PPO | Admitting: Family Medicine

## 2019-06-14 ENCOUNTER — Encounter: Payer: Self-pay | Admitting: Family Medicine

## 2019-06-14 ENCOUNTER — Other Ambulatory Visit: Payer: Self-pay

## 2019-06-14 DIAGNOSIS — E785 Hyperlipidemia, unspecified: Secondary | ICD-10-CM

## 2019-06-14 DIAGNOSIS — Z0000 Encounter for general adult medical examination without abnormal findings: Secondary | ICD-10-CM

## 2019-06-14 DIAGNOSIS — E559 Vitamin D deficiency, unspecified: Secondary | ICD-10-CM

## 2019-06-14 DIAGNOSIS — W460XXA Contact with hypodermic needle, initial encounter: Secondary | ICD-10-CM

## 2019-06-14 NOTE — Progress Notes (Signed)
I have discussed the procedure for the virtual visit with the patient who has given consent to proceed with assessment and treatment.   Pt unable to obtain vitals.   Daleena Rotter L Marqueta Pulley, CMA     

## 2019-06-14 NOTE — Progress Notes (Signed)
   Virtual Visit via Video   I connected with patient on 06/14/19 at  3:00 PM EST by a video enabled telemedicine application and verified that I am speaking with the correct person using two identifiers.  Location patient: Home Location provider: Acupuncturist, Office Persons participating in the virtual visit: Patient, Provider, [place] (Jess B)  I discussed the limitations of evaluation and management by telemedicine and the availability of in person appointments. The patient expressed understanding and agreed to proceed.  Subjective:   HPI:   CPE- UTD on pap, flu shot, Tdap.  Pt had an accidental needle stick in early October- has not yet been tested.  ROS:  Patient reports no vision/ hearing changes, adenopathy,fever, weight change,  persistant/recurrent hoarseness , swallowing issues, chest pain, palpitations, edema, persistant/recurrent cough, hemoptysis, dyspnea (rest/exertional/paroxysmal nocturnal), gastrointestinal bleeding (melena, rectal bleeding), abdominal pain, significant heartburn, bowel changes, GU symptoms (dysuria, hematuria, incontinence), Gyn symptoms (abnormal  bleeding, pain),  syncope, focal weakness, memory loss, numbness & tingling, skin/hair/nail changes, abnormal bruising or bleeding, anxiety, or depression.   Patient Active Problem List   Diagnosis Date Noted  . 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 Routine general medical examination at a health care facility 10/12/2012    Social History   Tobacco Use  . Smoking status: Never Smoker  . Smokeless tobacco: Never Used  Substance Use Topics  . Alcohol use: No    Current Outpatient Medications:  .  valACYclovir (VALTREX) 1000 MG tablet, Take 1,000 mg by mouth daily., Disp: , Rfl:   No Known Allergies  Objective:   There were no vitals taken for this visit.  AAOx3, NAD NCAT, EOMI No obvious CN deficits Coloring WNL Pt is able to speak clearly, coherently without shortness of breath or increased  work of breathing.  Thought process is linear.  Mood is appropriate.   Assessment and Plan:   CPE- Pt's video physical was WNL but limited.  UTD on pap, immunizations.  Check labs.  Anticipatory guidance provided.   Vit D deficiency- pt has hx of this.  Check labs and replete prn.  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  Reported this to [place] but has not yet been contacted about testing.  Needs HIV, Hep B and Hep C testing.   Annye Asa, MD 06/14/2019

## 2019-06-19 ENCOUNTER — Ambulatory Visit: Payer: BC Managed Care – PPO

## 2019-06-25 ENCOUNTER — Telehealth: Payer: Self-pay

## 2019-06-25 ENCOUNTER — Other Ambulatory Visit: Payer: Self-pay

## 2019-06-25 ENCOUNTER — Ambulatory Visit (INDEPENDENT_AMBULATORY_CARE_PROVIDER_SITE_OTHER): Payer: BC Managed Care – PPO

## 2019-06-25 DIAGNOSIS — W460XXA Contact with hypodermic needle, initial encounter: Secondary | ICD-10-CM

## 2019-06-25 DIAGNOSIS — Z0000 Encounter for general adult medical examination without abnormal findings: Secondary | ICD-10-CM | POA: Diagnosis not present

## 2019-06-25 DIAGNOSIS — E559 Vitamin D deficiency, unspecified: Secondary | ICD-10-CM

## 2019-06-25 DIAGNOSIS — E785 Hyperlipidemia, unspecified: Secondary | ICD-10-CM | POA: Diagnosis not present

## 2019-06-25 LAB — CBC WITH DIFFERENTIAL/PLATELET
Basophils Absolute: 0 10*3/uL (ref 0.0–0.1)
Basophils Relative: 0.9 % (ref 0.0–3.0)
Eosinophils Absolute: 0.1 10*3/uL (ref 0.0–0.7)
Eosinophils Relative: 2.2 % (ref 0.0–5.0)
HCT: 39.2 % (ref 36.0–46.0)
Hemoglobin: 13 g/dL (ref 12.0–15.0)
Lymphocytes Relative: 53.2 % — ABNORMAL HIGH (ref 12.0–46.0)
Lymphs Abs: 2.2 10*3/uL (ref 0.7–4.0)
MCHC: 33.2 g/dL (ref 30.0–36.0)
MCV: 94 fl (ref 78.0–100.0)
Monocytes Absolute: 0.3 10*3/uL (ref 0.1–1.0)
Monocytes Relative: 8 % (ref 3.0–12.0)
Neutro Abs: 1.5 10*3/uL (ref 1.4–7.7)
Neutrophils Relative %: 35.7 % — ABNORMAL LOW (ref 43.0–77.0)
Platelets: 280 10*3/uL (ref 150.0–400.0)
RBC: 4.17 Mil/uL (ref 3.87–5.11)
RDW: 13 % (ref 11.5–15.5)
WBC: 4.2 10*3/uL (ref 4.0–10.5)

## 2019-06-25 LAB — LIPID PANEL
Cholesterol: 244 mg/dL — ABNORMAL HIGH (ref 0–200)
HDL: 77.4 mg/dL (ref >39.00)
LDL Cholesterol: 145 mg/dL — ABNORMAL HIGH (ref 0–99)
NonHDL: 166.7
Total CHOL/HDL Ratio: 3
Triglycerides: 107 mg/dL (ref 0.0–149.0)
VLDL: 21.4 mg/dL (ref 0.0–40.0)

## 2019-06-25 LAB — BASIC METABOLIC PANEL
BUN: 15 mg/dL (ref 6–23)
CO2: 27 mEq/L (ref 19–32)
Calcium: 9.2 mg/dL (ref 8.4–10.5)
Chloride: 101 mEq/L (ref 96–112)
Creatinine, Ser: 0.79 mg/dL (ref 0.40–1.20)
GFR: 99.44 mL/min (ref >60.00)
Glucose, Bld: 82 mg/dL (ref 70–99)
Potassium: 4 mEq/L (ref 3.5–5.1)
Sodium: 136 mEq/L (ref 135–145)

## 2019-06-25 LAB — HEPATIC FUNCTION PANEL
ALT: 12 U/L (ref 0–35)
AST: 15 U/L (ref 0–37)
Albumin: 4.5 g/dL (ref 3.5–5.2)
Alkaline Phosphatase: 41 U/L (ref 39–117)
Bilirubin, Direct: 0.1 mg/dL (ref 0.0–0.3)
Total Bilirubin: 0.6 mg/dL (ref 0.2–1.2)
Total Protein: 6.9 g/dL (ref 6.0–8.3)

## 2019-06-25 LAB — VITAMIN D 25 HYDROXY (VIT D DEFICIENCY, FRACTURES): VITD: 22.17 ng/mL — ABNORMAL LOW (ref 30.00–100.00)

## 2019-06-25 LAB — TSH: TSH: 0.98 u[IU]/mL (ref 0.35–4.50)

## 2019-06-25 NOTE — Telephone Encounter (Signed)
Called pt and gave PCP recommendations. She states she has been doing that. I offered her an appointment and she declined to schedule. Said she would "figure" it out.

## 2019-06-25 NOTE — Telephone Encounter (Signed)
Start w/ OTC hydrocortisone cream.  If no improvement, will need appt to assess

## 2019-06-25 NOTE — Telephone Encounter (Signed)
Patient seen today for nurse visit. States during her visit that she has been experiencing a "rash: on her left wrist. Started on the top of her hand and moved to below her watch. It has scabbed over at both locations, but wanted to know what PCP would recommend for treatment. Did not show PCP during last visit because it was not visible due to it being virtual visit. Informed patient that I would route message to PCP because she was in clinic with other patients.

## 2019-06-28 ENCOUNTER — Other Ambulatory Visit: Payer: Self-pay | Admitting: General Practice

## 2019-06-28 IMAGING — MG DIGITAL SCREENING BILATERAL MAMMOGRAM WITH CAD
4 series · 4 of 4 positions shown · non-contrast
Comparison: None.

CLINICAL DATA: Screening.

EXAM:
DIGITAL SCREENING BILATERAL MAMMOGRAM WITH CAD

[R CC]
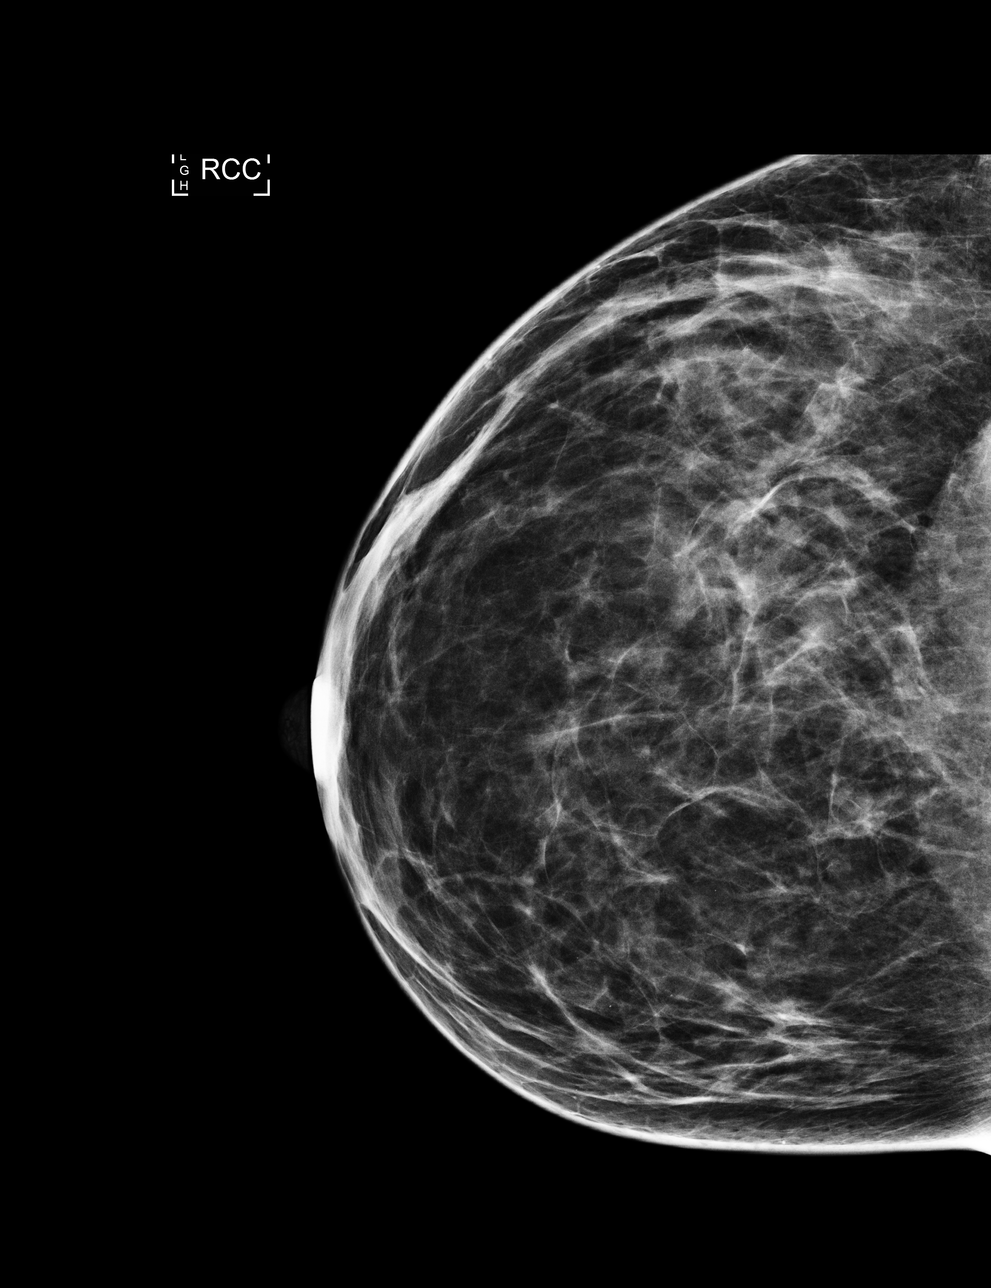

[L MLO]
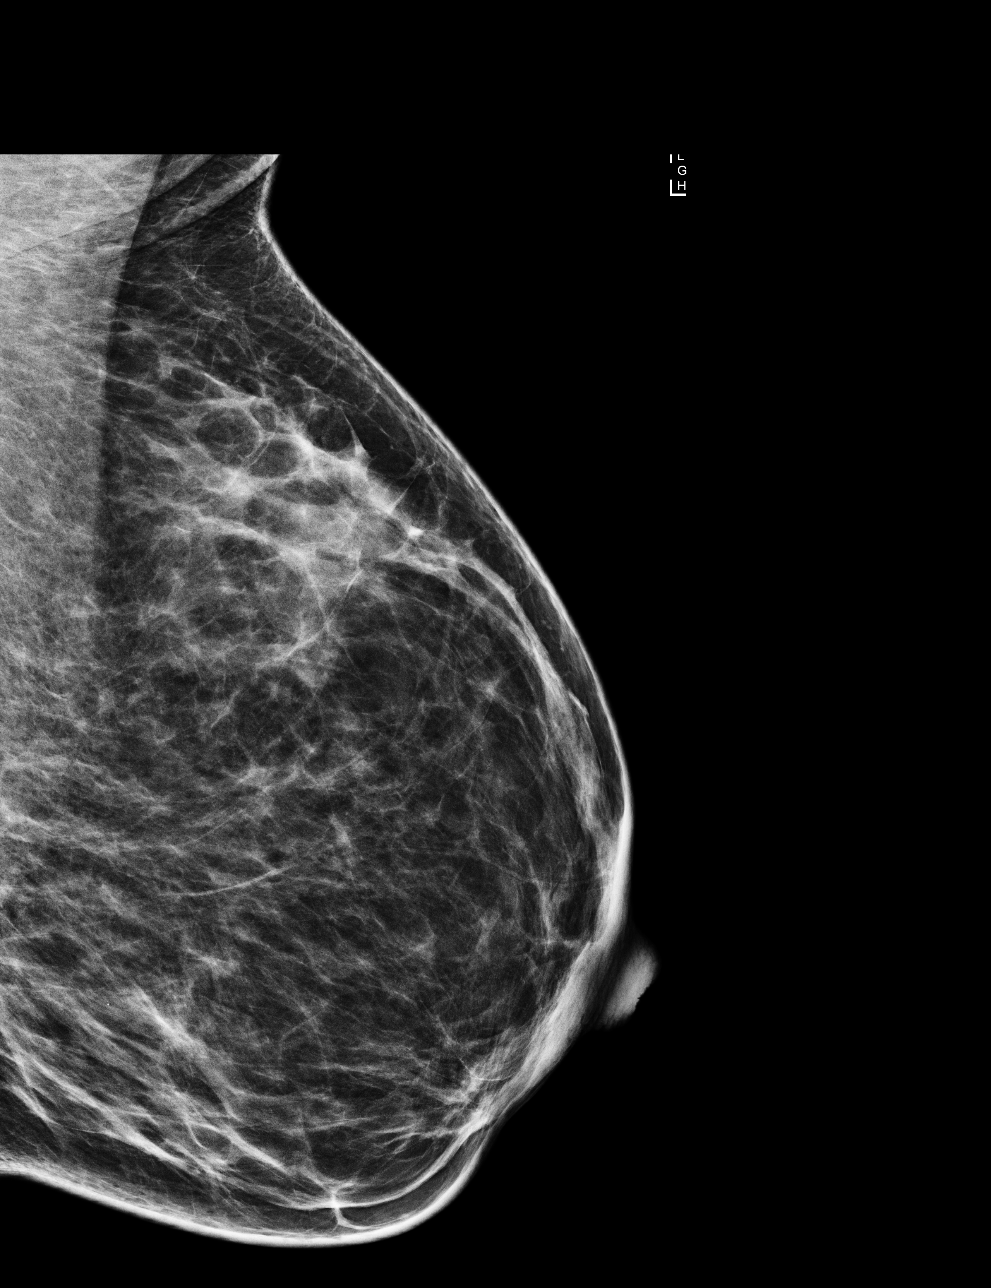

[R MLO]
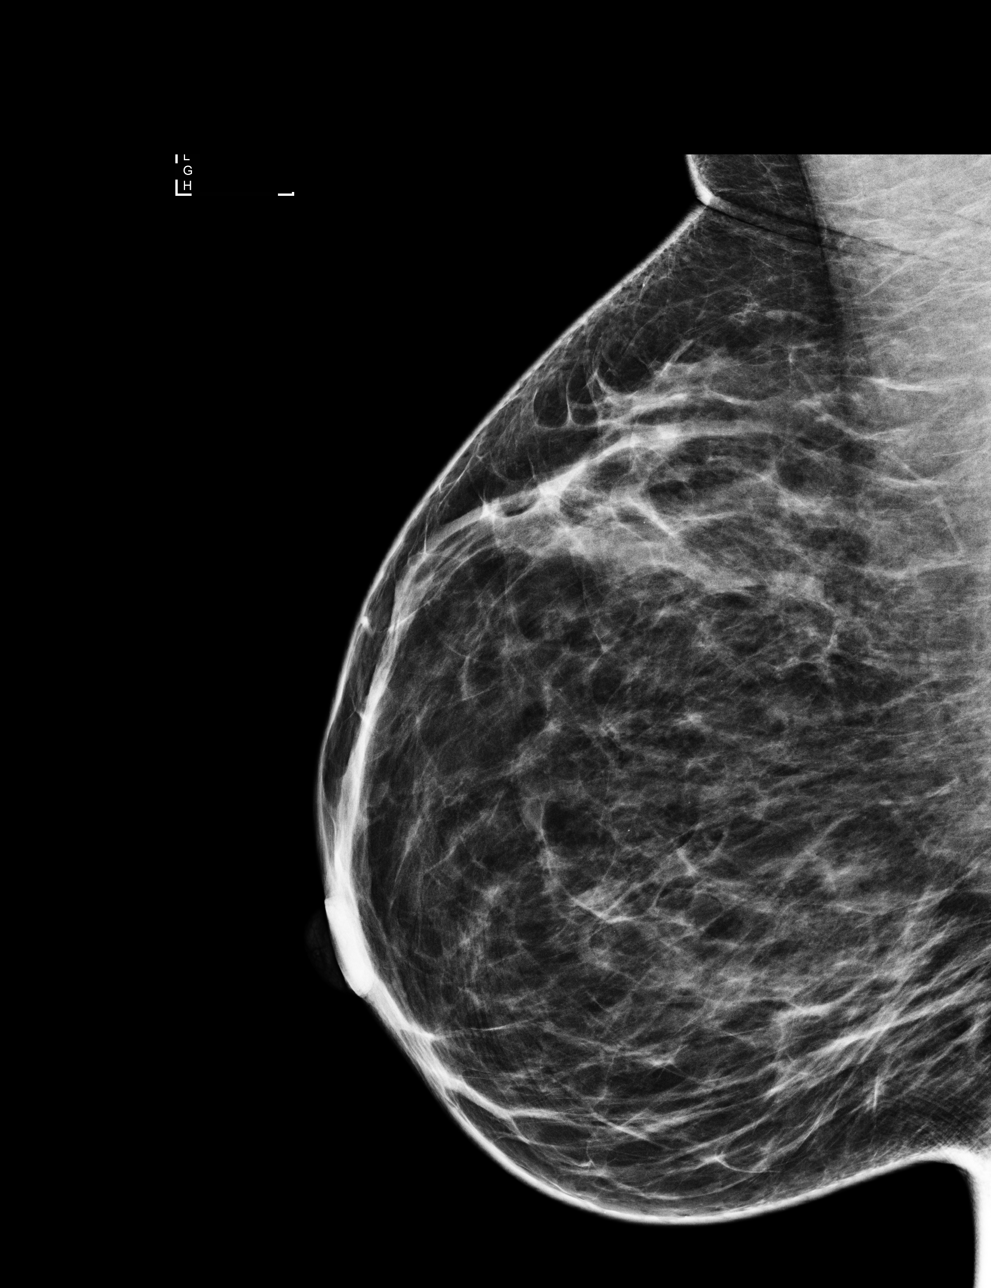

[L CC]
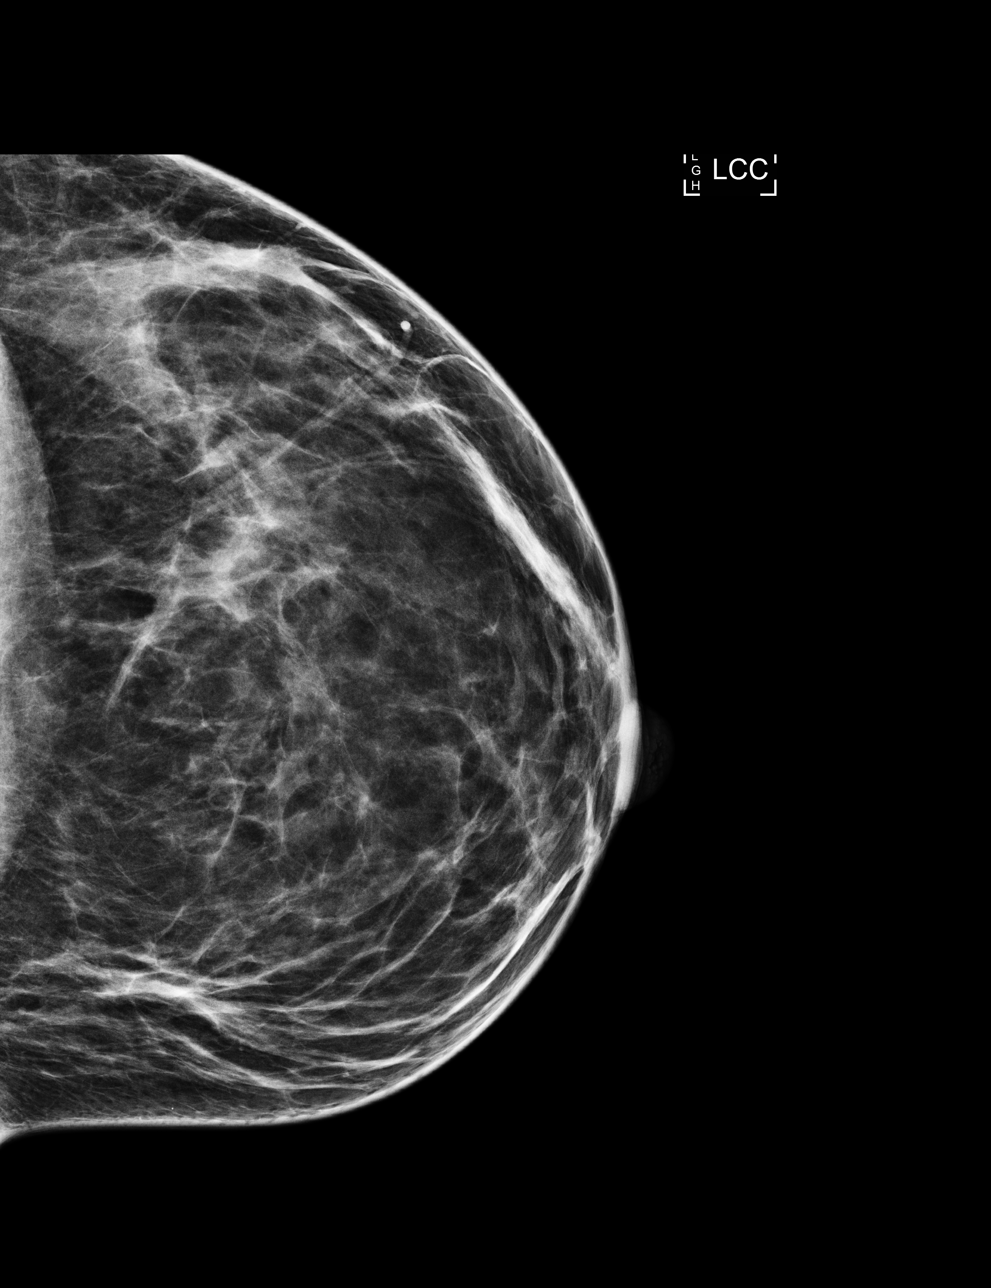

[4 of 4 positions shown; findings below may reference images not displayed]

ACR Breast Density Category b: There are scattered areas of
fibroglandular density.
FINDINGS: There are no findings suspicious for malignancy. Images were
processed with CAD.
IMPRESSION: No mammographic evidence of malignancy. A result letter of this
screening mammogram will be mailed directly to the patient.

RECOMMENDATION:
Screening mammogram at age 40. (Code:VT-[id number])

BI-RADS CATEGORY  1: Negative.

## 2019-06-28 MED ORDER — VITAMIN D (ERGOCALCIFEROL) 1.25 MG (50000 UNIT) PO CAPS: 50000.0000 [IU] | ORAL_CAPSULE | ORAL | 0 refills | Status: DC

## 2019-09-03 ENCOUNTER — Telehealth: Payer: Self-pay | Admitting: Family Medicine

## 2019-09-03 NOTE — Telephone Encounter (Signed)
Please advise 

## 2019-09-03 NOTE — Telephone Encounter (Signed)
Pt called in asking if she could have her cholesterol level recked since they were high in Dec. Please advise on when pt should have this done.

## 2019-09-04 ENCOUNTER — Other Ambulatory Visit: Payer: Self-pay | Admitting: Family Medicine

## 2019-09-04 DIAGNOSIS — E785 Hyperlipidemia, unspecified: Secondary | ICD-10-CM

## 2019-09-04 NOTE — Telephone Encounter (Signed)
It takes at least 3 months for cholesterol numbers to show any significant changes.  So that would mean she could schedule a lab visit for lipid panel and LFTs (dx hyperlipidemia) sometime in March

## 2019-09-04 NOTE — Telephone Encounter (Signed)
Called pt. Scheduled a lab visit for March 1st. Labs ordered.

## 2019-09-05 ENCOUNTER — Ambulatory Visit: Payer: BC Managed Care – PPO | Attending: Internal Medicine

## 2019-09-05 DIAGNOSIS — Z20822 Contact with and (suspected) exposure to covid-19: Secondary | ICD-10-CM

## 2019-09-06 LAB — NOVEL CORONAVIRUS, NAA: SARS-CoV-2, NAA: NOT DETECTED

## 2019-09-23 ENCOUNTER — Ambulatory Visit: Payer: BC Managed Care – PPO

## 2019-10-28 DIAGNOSIS — A6000 Herpesviral infection of urogenital system, unspecified: Secondary | ICD-10-CM | POA: Insufficient documentation

## 2019-12-31 ENCOUNTER — Telehealth: Payer: Self-pay | Admitting: Family Medicine

## 2019-12-31 DIAGNOSIS — E785 Hyperlipidemia, unspecified: Secondary | ICD-10-CM

## 2019-12-31 NOTE — Telephone Encounter (Signed)
Patient had an appt for this Thursday with Dr. Beverely Low for a 6 month fu on cholesterol.   Patient is not able to keep that appt due to her personal schedule.  She would like to know if Dr. Beverely Low would be able to order her labs before her appt with Dr. Beverely Low in order for her to review her labs at her appt.  Please advise.

## 2019-12-31 NOTE — Telephone Encounter (Signed)
Ok for lipid panel, LFTs, BMP at lab visit and pt needs to reschedule appt at her convenience

## 2019-12-31 NOTE — Telephone Encounter (Signed)
Orders placed. Pt can be scheduled for labs. And rescheduled appt with PCP to discuss results.

## 2019-12-31 NOTE — Telephone Encounter (Signed)
Please advise 

## 2019-12-31 NOTE — Telephone Encounter (Signed)
Patient has been scheduled for both.  

## 2020-01-02 ENCOUNTER — Ambulatory Visit: Payer: BC Managed Care – PPO | Admitting: Family Medicine

## 2020-01-07 ENCOUNTER — Other Ambulatory Visit: Payer: Self-pay

## 2020-01-07 ENCOUNTER — Ambulatory Visit (INDEPENDENT_AMBULATORY_CARE_PROVIDER_SITE_OTHER): Payer: BC Managed Care – PPO

## 2020-01-07 ENCOUNTER — Telehealth: Payer: Self-pay | Admitting: Family Medicine

## 2020-01-07 DIAGNOSIS — E785 Hyperlipidemia, unspecified: Secondary | ICD-10-CM | POA: Diagnosis not present

## 2020-01-07 LAB — HEPATIC FUNCTION PANEL
ALT: 12 U/L (ref 0–35)
AST: 17 U/L (ref 0–37)
Albumin: 4.4 g/dL (ref 3.5–5.2)
Alkaline Phosphatase: 41 U/L (ref 39–117)
Bilirubin, Direct: 0.1 mg/dL (ref 0.0–0.3)
Total Bilirubin: 0.7 mg/dL (ref 0.2–1.2)
Total Protein: 6.8 g/dL (ref 6.0–8.3)

## 2020-01-07 LAB — BASIC METABOLIC PANEL
BUN: 16 mg/dL (ref 6–23)
CO2: 29 mEq/L (ref 19–32)
Calcium: 9.5 mg/dL (ref 8.4–10.5)
Chloride: 102 mEq/L (ref 96–112)
Creatinine, Ser: 0.94 mg/dL (ref 0.40–1.20)
GFR: 81.13 mL/min (ref >60.00)
Glucose, Bld: 78 mg/dL (ref 70–99)
Potassium: 3.7 mEq/L (ref 3.5–5.1)
Sodium: 137 mEq/L (ref 135–145)

## 2020-01-07 LAB — LIPID PANEL
Cholesterol: 202 mg/dL — ABNORMAL HIGH (ref 0–200)
HDL: 82.9 mg/dL (ref >39.00)
LDL Cholesterol: 105 mg/dL — ABNORMAL HIGH (ref 0–99)
NonHDL: 119.09
Total CHOL/HDL Ratio: 2
Triglycerides: 68 mg/dL (ref 0.0–149.0)
VLDL: 13.6 mg/dL (ref 0.0–40.0)

## 2020-01-07 NOTE — Telephone Encounter (Signed)
FYI

## 2020-01-07 NOTE — Telephone Encounter (Signed)
Pt is aware but isn't very happy about scheduling an appt.

## 2020-01-07 NOTE — Telephone Encounter (Signed)
Pt called in stating that her labs are fine, she wanted to know if she needs to keep her appt with Dr.Tabori on 01/31/20, please advise

## 2020-01-07 NOTE — Telephone Encounter (Signed)
Yes, per our records she has not seen Dr. Beverely Low in over 6 months. With cholesterol issues she needs to have an appointment to document this with PCP every 6 months. Since she had labs drawn she may have a virtual visit if she prefers

## 2020-01-08 NOTE — Telephone Encounter (Signed)
Pt will call back to schedule a CPE appt with Dr. Beverely Low..   Thanks,  Alcario Drought

## 2020-01-08 NOTE — Telephone Encounter (Signed)
Ok for pt to cancel appt since labs look good.  Please make sure she has a physical scheduled

## 2020-01-31 ENCOUNTER — Telehealth: Payer: BC Managed Care – PPO | Admitting: Family Medicine

## 2020-06-23 ENCOUNTER — Other Ambulatory Visit: Payer: Self-pay

## 2020-06-23 ENCOUNTER — Encounter: Payer: Self-pay | Admitting: Orthopaedic Surgery

## 2020-06-23 ENCOUNTER — Ambulatory Visit: Payer: Self-pay

## 2020-06-23 ENCOUNTER — Ambulatory Visit (INDEPENDENT_AMBULATORY_CARE_PROVIDER_SITE_OTHER): Payer: BC Managed Care – PPO | Admitting: Orthopaedic Surgery

## 2020-06-23 DIAGNOSIS — M222X2 Patellofemoral disorders, left knee: Secondary | ICD-10-CM | POA: Diagnosis not present

## 2020-06-23 NOTE — Progress Notes (Signed)
Office Visit Note   Patient: Alexandra Bradley           Date of Birth: 11/08/1980           MRN: [id number] Visit Date: 06/23/2020              Requested by: Sheliah Hatch, MD [address] [place],  [place] [address] PCP: Sheliah Hatch, MD   Assessment & Plan: Visit Diagnoses:  1. Patellofemoral syndrome of left knee     Plan: Impression is left knee patellofemoral syndrome.  The patient symptoms are only occurring while running.  She currently has a PSO brace to have instructed her to wear this while running into preload with NSAIDs.  We will also refer her to physical therapy for 1 visit for the PT delay on a home exercise program.  She will follow up with [place] as needed.  Follow-Up Instructions: Return if symptoms worsen or fail to improve.   Orders:  Orders Placed This Encounter  Procedures  . XR KNEE 3 VIEW LEFT   No orders of the defined types were placed in this encounter.     Procedures: No procedures performed   Clinical Data: No additional findings.   Subjective: Chief Complaint  Patient presents with  . Left Knee - Pain    HPI patient is a pleasant 39 year old female who comes in today with left knee pain.  She has had intermittent left knee pain for years but has noticed increased pain and the feeling of her knee popping out of place for the past week while running.  She is currently training for a half marathon.  No new injury.  The symptoms only occur while running.  The pain is all retropatellar.  No locking, catching or instability.  She does have a history of what sounds like patellofemoral syndrome on the right which improved with physical therapy and bracing.  Review of Systems as detailed in HPI.  All others reviewed and are negative.   Objective: Vital Signs: There were no vitals taken for this visit.  Physical Exam well-developed well-nourished female no acute distress.  Alert and oriented x3.  Ortho Exam left knee exam shows  increased Q angle.  No effusion.  Range of motion 0 to 120 degrees.  No joint line tenderness.  Mild patellofemoral crepitus.  No patellar apprehension.  5 out of 5 strength with resisted straight leg raise.  Specialty Comments:  No specialty comments available.  Imaging: XR KNEE 3 VIEW LEFT  Result Date: 06/23/2020 No acute or structural abnormalities    PMFS History: Patient Active Problem List   Diagnosis Date Noted  . Needle stick, hypodermic, accidental 06/14/2019  . Vitamin D deficiency 08/18/2017  . 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 Hyperlipidemia 10/12/2012  . Routine general medical examination at a health care facility 10/12/2012   Past Medical History:  Diagnosis Date  . Chicken pox   . Hyperlipidemia     Family History  Problem Relation Age of Onset  . Arthritis Mother   . Hyperlipidemia Mother   . Hypertension Mother   . Arthritis Father   . Hyperlipidemia Father   . Hypertension Father   . Diabetes Father   . Arthritis Maternal Grandmother   . Cancer Sister        breast  . Hyperlipidemia Sister   . Breast cancer Sister   . Hyperlipidemia Brother     Past Surgical History:  Procedure Laterality Date  .  breast biopsy  2005 and 2006  . BREAST BIOPSY     Social History   Occupational History  . Not on file  Tobacco Use  . Smoking status: Never Smoker  . Smokeless tobacco: Never Used  Vaping Use  . Vaping Use: Never used  Substance and Sexual Activity  . Alcohol use: No  . Drug use: No  . Sexual activity: Yes

## 2020-06-26 ENCOUNTER — Encounter: Payer: Self-pay | Admitting: Physical Therapy

## 2020-06-26 ENCOUNTER — Ambulatory Visit (INDEPENDENT_AMBULATORY_CARE_PROVIDER_SITE_OTHER): Payer: BC Managed Care – PPO | Admitting: Physical Therapy

## 2020-06-26 ENCOUNTER — Other Ambulatory Visit: Payer: Self-pay

## 2020-06-26 DIAGNOSIS — M25562 Pain in left knee: Secondary | ICD-10-CM | POA: Diagnosis not present

## 2020-06-26 NOTE — Therapy (Signed)
[facility] Physical [facility] [address] [place], [place], [address] Phone: [phone]   Fax:  [phone]  Physical Therapy Evaluation  Patient Details  Name: Alexandra Bradley MRN: [id number] Date of Birth: 1981-02-09 Referring Provider (PT): Cristie Hem, [place]   Encounter Date: 06/26/2020   PT End of Session - 06/26/20 1055    Visit Number 1    Date for PT Re-Evaluation 08/07/20    PT Start Time 1020    PT Stop Time 1052    PT Time Calculation (min) 32 min    Activity Tolerance Patient tolerated treatment well    Behavior During Therapy [facility] for tasks assessed/performed           Past Medical History:  Diagnosis Date  . Chicken pox   . Hyperlipidemia     Past Surgical History:  Procedure Laterality Date  . breast biopsy  2005 and 2006  . BREAST BIOPSY      There were no vitals filed for this visit.    Subjective Assessment - 06/26/20 1022    Subjective Pt is a 39 y/o female who presents to OPPT for Lt knee pain.  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  Recently she had episode where it felt like "knee popped out of place" and running was increasing the pain mostly anterior under patella.  She is training for a half marathon in April.    Diagnostic tests xrays- normal    Patient Stated Goals improve pain, run half marathon    Currently in Pain? No/denies              [facility] PT Assessment - 06/26/20 1026      Assessment   Medical Diagnosis Left knee patellofemoral syndrome    Referring Provider (PT) Cristie Hem, PA-C    Onset Date/Surgical Date 06/15/20    Next MD Visit PRN    Prior Therapy 1 session at [facility]      Precautions   Precautions None      Restrictions   Weight Bearing Restrictions No      Balance Screen   Has the patient fallen in the past 6 months No    Has the patient had a decrease in activity level because of a fear of falling?  No    Is the patient  reluctant to leave their home because of a fear of falling?  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    Leisure running, strength training 2-3x/wk (4 days/wk prior to starting 1/2 marathon training)      Cognition   Overall Cognitive Status Within Functional Limits for tasks assessed      ROM / Strength   AROM / PROM / Strength AROM;Strength      AROM   Overall AROM Comments WNL      Strength   Strength Assessment Site Hip;Knee    Right/Left Hip Right;Left    Right Hip Extension 5/5    Right Hip External Rotation  5/5    Right Hip Internal Rotation 5/5    Right Hip ABduction 4+/5  Right Hip ADduction 5/5    Left Hip Extension 4+/5    Left Hip External Rotation 4/5    Left Hip Internal Rotation 5/5    Left Hip ABduction 4/5    Left Hip ADduction 4/5    Right/Left Knee Right;Left    Right Knee Flexion 5/5    Right Knee Extension 5/5    Left Knee Flexion 5/5    Left Knee Extension 4/5   with pain     Palpation   Patella mobility lateral tracking bil                      Objective measurements completed on examination: See above findings.       [facility] Adult PT Treatment/Exercise - 06/26/20 1054      Exercises   Exercises Other Exercises    Other Exercises  see pt instructions - reviewed with pt - she verbalized understanding                  PT Education - 06/26/20 1055    Education Details HEP    Person(s) Educated Patient    Methods Explanation;Demonstration;Handout    Comprehension Verbalized understanding;Returned demonstration               PT Long Term Goals - 06/26/20 1152      PT LONG TERM GOAL #1   Title to be determined if  pt returns                  Plan - 06/26/20 1046    Clinical Impression Statement Pt is a 39 y/o female who presents to OPPT for Lt knee pain, worse with running and stairs.  She demonstrates decreased strength and lateral tracking patella, so issued HEP today to address deficits.  Plans to call to schedule if needed.    Examination-Activity Limitations Locomotion Level;Stairs    Examination-Participation Restrictions Community Activity    Stability/Clinical Decision Making Stable/Uncomplicated    Clinical Decision Making Low    Rehab Potential Good    PT Frequency --   to be determined   PT Duration --   to be determined   PT Treatment/Interventions ADLs/Self Care Home Management;Electrical Stimulation;Therapeutic activities;Functional mobility training;Stair training;Gait training;Therapeutic exercise;Neuromuscular re-education;Taping;Dry needling;Manual techniques;Patient/family education;Moist Heat;Cryotherapy    PT Next Visit Plan if pt returns, reassess and look at HEP, consider taping if needed    PT Home Exercise Plan Access Code: [email or web address]    Consulted and Agree with Plan of Care Patient           Patient will benefit from skilled therapeutic intervention in order to improve the following deficits and impairments:  Decreased strength, Pain  Visit Diagnosis: Acute pain of left knee - Plan: PT plan of care cert/re-cert     Problem List Patient Active Problem List   Diagnosis Date Noted  . Needle stick, hypodermic, accidental 06/14/2019  . Vitamin D deficiency 08/18/2017  . 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 Hyperlipidemia 10/12/2012  . Routine general medical examination at a health care facility 10/12/2012       Clarita Crane, PT, DPT 06/26/20 11:54 AM     [facility] Physical Therapy [address] [place], [place], [address] Phone: [phone]   Fax:  [phone]  Name: Alexandra Bradley MRN: [id number] Date of Birth:  [birth year]

## 2020-06-26 NOTE — Patient Instructions (Signed)
Access Code: [email or web address] URL: [email or web address] Date: 06/26/2020 Prepared by: Moshe Cipro  Exercises Long Sitting Straight Leg Raise with External Rotation - 2 x daily - 7 x weekly - 3 sets - 10 reps Sidelying Hip Adduction - 2 x daily - 7 x weekly - 3 sets - 10 reps Wall Squat Hold with Ball - 2 x daily - 7 x weekly - 3 sets - 10 reps Single Leg Deadlift with Kettlebell - 2 x daily - 7 x weekly - 3 sets - 10 reps Single Leg Knee Extension with Weight Machine - 1 x daily - 7 x weekly - 3 sets - 10 reps Single Leg Press - 1 x daily - 7 x weekly - 3 sets - 10 reps

## 2020-06-29 ENCOUNTER — Other Ambulatory Visit: Payer: Self-pay

## 2020-06-29 ENCOUNTER — Ambulatory Visit (INDEPENDENT_AMBULATORY_CARE_PROVIDER_SITE_OTHER): Payer: BC Managed Care – PPO | Admitting: Family Medicine

## 2020-06-29 ENCOUNTER — Telehealth: Payer: Self-pay | Admitting: Family Medicine

## 2020-06-29 ENCOUNTER — Encounter: Payer: Self-pay | Admitting: Family Medicine

## 2020-06-29 VITALS — BP 124/83 | HR 53 | Temp 97.3°F | Resp 15 | Ht 62.0 in | Wt 143.0 lb

## 2020-06-29 DIAGNOSIS — Z0000 Encounter for general adult medical examination without abnormal findings: Secondary | ICD-10-CM

## 2020-06-29 DIAGNOSIS — E559 Vitamin D deficiency, unspecified: Secondary | ICD-10-CM

## 2020-06-29 DIAGNOSIS — E785 Hyperlipidemia, unspecified: Secondary | ICD-10-CM

## 2020-06-29 LAB — BASIC METABOLIC PANEL
BUN: 17 mg/dL (ref 6–23)
CO2: 28 mEq/L (ref 19–32)
Calcium: 9.3 mg/dL (ref 8.4–10.5)
Chloride: 105 mEq/L (ref 96–112)
Creatinine, Ser: 0.83 mg/dL (ref 0.40–1.20)
GFR: 90.1 mL/min (ref >60.00)
Glucose, Bld: 76 mg/dL (ref 70–99)
Potassium: 4 mEq/L (ref 3.5–5.1)
Sodium: 141 mEq/L (ref 135–145)

## 2020-06-29 LAB — CBC WITH DIFFERENTIAL/PLATELET
Basophils Absolute: 0 10*3/uL (ref 0.0–0.1)
Basophils Relative: 1.1 % (ref 0.0–3.0)
Eosinophils Absolute: 0.1 10*3/uL (ref 0.0–0.7)
Eosinophils Relative: 3 % (ref 0.0–5.0)
HCT: 38.7 % (ref 36.0–46.0)
Hemoglobin: 13.1 g/dL (ref 12.0–15.0)
Lymphocytes Relative: 54.2 % — ABNORMAL HIGH (ref 12.0–46.0)
Lymphs Abs: 2 10*3/uL (ref 0.7–4.0)
MCHC: 33.7 g/dL (ref 30.0–36.0)
MCV: 92.9 fl (ref 78.0–100.0)
Monocytes Absolute: 0.3 10*3/uL (ref 0.1–1.0)
Monocytes Relative: 8.9 % (ref 3.0–12.0)
Neutro Abs: 1.2 10*3/uL — ABNORMAL LOW (ref 1.4–7.7)
Neutrophils Relative %: 32.8 % — ABNORMAL LOW (ref 43.0–77.0)
Platelets: 276 10*3/uL (ref 150.0–400.0)
RBC: 4.17 Mil/uL (ref 3.87–5.11)
RDW: 12.6 % (ref 11.5–15.5)
WBC: 3.6 10*3/uL — ABNORMAL LOW (ref 4.0–10.5)

## 2020-06-29 LAB — HEPATIC FUNCTION PANEL
ALT: 11 U/L (ref 0–35)
AST: 19 U/L (ref 0–37)
Albumin: 4.4 g/dL (ref 3.5–5.2)
Alkaline Phosphatase: 36 U/L — ABNORMAL LOW (ref 39–117)
Bilirubin, Direct: 0.1 mg/dL (ref 0.0–0.3)
Total Bilirubin: 0.8 mg/dL (ref 0.2–1.2)
Total Protein: 6.8 g/dL (ref 6.0–8.3)

## 2020-06-29 LAB — LIPID PANEL
Cholesterol: 214 mg/dL — ABNORMAL HIGH (ref 0–200)
HDL: 85.6 mg/dL (ref >39.00)
LDL Cholesterol: 117 mg/dL — ABNORMAL HIGH (ref 0–99)
NonHDL: 128.61
Total CHOL/HDL Ratio: 3
Triglycerides: 60 mg/dL (ref 0.0–149.0)
VLDL: 12 mg/dL (ref 0.0–40.0)

## 2020-06-29 LAB — VITAMIN D 25 HYDROXY (VIT D DEFICIENCY, FRACTURES): VITD: 27.59 ng/mL — ABNORMAL LOW (ref 30.00–100.00)

## 2020-06-29 LAB — TSH: TSH: 0.61 u[IU]/mL (ref 0.35–4.50)

## 2020-06-29 NOTE — Assessment & Plan Note (Signed)
Pt's PE WNL.  UTD on pap, immunizations.  Check labs.  Anticipatory guidance provided.  

## 2020-06-29 NOTE — Assessment & Plan Note (Signed)
Check labs and replete prn. 

## 2020-06-29 NOTE — Patient Instructions (Signed)
Follow up in 1 year or as needed We'll notify you of your lab results and make any changes if needed Continue to work on healthy diet and regular exercise- you look great! Call with any questions or concerns Stay safe!  Stay healthy! Happy Holidays!!

## 2020-06-29 NOTE — Assessment & Plan Note (Signed)
Chronic problem.  Admits that her diet has not been what it should be to control her cholesterol.  States she knows what she needs to do.  Will check labs and determine if additional measures need to be taken.

## 2020-06-29 NOTE — Progress Notes (Signed)
   Subjective:    Patient ID: Alexandra Bradley, female    DOB: 06/29/1981, 39 y.o.   MRN: [id number]  HPI CPE- UTD on pap (April 2021), Tdap, flu, COVID.  No concerns today  Reviewed past medical, surgical, family and social histories.   Patient Care Team    Relationship Specialty Notifications Start End  Sheliah Hatch, MD PCP - General Family Medicine  08/18/17   Mitchel Honour, DO Consulting Physician Obstetrics and Gynecology  08/18/17       Review of Systems Patient reports no vision/ hearing changes, adenopathy,fever, weight change,  persistant/recurrent hoarseness , swallowing issues, chest pain, palpitations, edema, persistant/recurrent cough, hemoptysis, dyspnea (rest/exertional/paroxysmal nocturnal), gastrointestinal bleeding (melena, rectal bleeding), abdominal pain, significant heartburn, bowel changes, GU symptoms (dysuria, hematuria, incontinence), Gyn symptoms (abnormal  bleeding, pain),  syncope, focal weakness, memory loss, numbness & tingling, skin/hair/nail changes, abnormal bruising or bleeding, anxiety, or depression.   This visit occurred during the SARS-CoV-2 public health emergency.  Safety protocols were in place, including screening questions prior to the visit, additional usage of staff PPE, and extensive cleaning of exam room while observing appropriate contact time as indicated for disinfecting solutions.       Objective:   Physical Exam General Appearance:    Alert, cooperative, no distress, appears stated age  Head:    Normocephalic, without obvious abnormality, atraumatic  Eyes:    PERRL, conjunctiva/corneas clear, EOM's intact, fundi    benign, both eyes  Ears:    Normal TM's and external ear canals, both ears  Nose:   Deferred due to COVID  Throat:   Neck:   Supple, symmetrical, trachea midline, no adenopathy;    Thyroid: no enlargement/tenderness/nodules  Back:     Symmetric, no curvature, ROM normal, no CVA tenderness  Lungs:     Clear to  auscultation bilaterally, respirations unlabored  Chest Wall:    No tenderness or deformity   Heart:    Regular rate and rhythm, S1 and S2 normal, no murmur, rub   or gallop  Breast Exam:    Deferred to GYN  Abdomen:     Soft, non-tender, bowel sounds active all four quadrants,    no masses, no organomegaly  Genitalia:    Deferred to GYN  Rectal:    Extremities:   Extremities normal, atraumatic, no cyanosis or edema  Pulses:   2+ and symmetric all extremities  Skin:   Skin color, texture, turgor normal, no rashes or lesions  Lymph nodes:   Cervical, supraclavicular, and axillary nodes normal  Neurologic:   CNII-XII intact, normal strength, sensation and reflexes    throughout          Assessment & Plan:

## 2020-06-29 NOTE — Telephone Encounter (Signed)
Pt called in wanting to speak with a nurse about her lab results, she states that some are higher then what they normally are so she is concerned

## 2020-06-30 ENCOUNTER — Other Ambulatory Visit: Payer: Self-pay

## 2020-06-30 ENCOUNTER — Encounter: Payer: Self-pay | Admitting: Family Medicine

## 2020-06-30 MED ORDER — VITAMIN D (ERGOCALCIFEROL) 1.25 MG (50000 UNIT) PO CAPS
50000.0000 [IU] | ORAL_CAPSULE | ORAL | 0 refills | Status: DC
Start: 2020-06-30 — End: 2021-08-02

## 2020-06-30 NOTE — Telephone Encounter (Signed)
I will send her a MyChart message

## 2020-07-20 ENCOUNTER — Telehealth: Payer: Self-pay

## 2020-07-20 NOTE — Telephone Encounter (Signed)
Unfortunately w/ pt volumes and COVID running rampant, I am not able to call.  If she has questions regarding COVID treatments and next steps, she will need to schedule an appt.

## 2020-07-20 NOTE — Telephone Encounter (Signed)
Patient states she will reach out to her children's doctor to have someone speak with her at no charge. She states she will call back if needed.

## 2020-07-20 NOTE — Telephone Encounter (Signed)
I'm so sorry to hear this.  Given that she has known exposure she needs to quarantine for 10 days and if at any time she develops symptoms she needs to be tested

## 2020-07-20 NOTE — Telephone Encounter (Signed)
Pt called stating everyone in her family tested positive for COVID except for her. Pt asked if nurse could call to give advise on what she should do and if there are any specific medications she should give to her family. Please advise.

## 2020-07-21 ENCOUNTER — Telehealth: Payer: BC Managed Care – PPO | Admitting: Family Medicine

## 2020-08-05 ENCOUNTER — Telehealth: Payer: Self-pay | Admitting: Physical Therapy

## 2020-08-05 NOTE — Telephone Encounter (Signed)
Returned pt's call about questions regarding knee.  Added hamstring stretch as she reports tightness behind knee after running, and discussed modifications to gym machine exercises to perform at home.  Encouraged continued exercise as long as she isn't experiencing elevated pain, and recommended she follow up with MD if needed.  Clarita Crane, PT, DPT 08/05/20 11:20 AM

## 2020-12-15 ENCOUNTER — Telehealth: Payer: Self-pay | Admitting: Family Medicine

## 2020-12-15 NOTE — Telephone Encounter (Signed)
Patient needs prescriptions for travel - Please advise - Malaria, typhoid, etc.  Patient may need yellow fever - [facility] [address] in [place].

## 2020-12-15 NOTE — Telephone Encounter (Signed)
We do not carry yellow fever vaccines so it would be best for her to schedule an appt with the travel clinic at the [facility]

## 2020-12-15 NOTE — Telephone Encounter (Signed)
Called patient to advise. Patient scheduled.

## 2020-12-15 NOTE — Telephone Encounter (Signed)
I need to know where she is going and this requires a visit to ensure her other immunizations are up to date and recommendations are followed

## 2021-01-05 ENCOUNTER — Ambulatory Visit: Payer: BC Managed Care – PPO | Admitting: Family Medicine

## 2021-01-20 ENCOUNTER — Encounter: Payer: Self-pay | Admitting: *Deleted

## 2021-05-27 ENCOUNTER — Telehealth: Payer: Self-pay | Admitting: Family Medicine

## 2021-05-27 ENCOUNTER — Other Ambulatory Visit: Payer: Self-pay

## 2021-05-27 ENCOUNTER — Telehealth: Payer: Self-pay

## 2021-05-27 DIAGNOSIS — J111 Influenza due to unidentified influenza virus with other respiratory manifestations: Secondary | ICD-10-CM

## 2021-05-27 MED ORDER — OSELTAMIVIR PHOSPHATE 6 MG/ML PO SUSR: 75.0000 mg | Freq: Every day | ORAL | 0 refills | Status: DC

## 2021-05-27 MED ORDER — OSELTAMIVIR PHOSPHATE 75 MG PO CAPS: 75.0000 mg | ORAL_CAPSULE | Freq: Every day | ORAL | 0 refills | Status: DC

## 2021-05-27 NOTE — Telephone Encounter (Signed)
Rx filled and sent to patient pharmacy. Patient called and notified.

## 2021-05-27 NOTE — Telephone Encounter (Signed)
Sent in liquid form

## 2021-05-27 NOTE — Telephone Encounter (Signed)
Dr Beverely Low, can we send this in liquid form for patient. Patient called and stated that is is on back order. Please advise

## 2021-05-27 NOTE — Telephone Encounter (Signed)
..  Caller name:Alexandra Bradley  On DPR? :yes/no: Yes  Call back number:[phone]  Provider they see: Beverely Low  Reason for call: Patient's daughter tested positive for the flu.  Patient would like to know if she can get tamiflu called in for her so that maybe she can avoid getting the flu also.

## 2021-05-27 NOTE — Telephone Encounter (Signed)
Dr Beverely Low, Can patient get Tamiflu sent to pharmacy for her. Daughter has tested positive for the flu.

## 2021-05-27 NOTE — Telephone Encounter (Signed)
Caller name:Lilybelle Hizer   On DPR? :no  Call back number:[phone]  Provider they see: Beverely Low   Reason for call: Pt called back and the Tamiflu is on back order and pt is wanting liquid tamiflu capsul is no longer available also pt wants pharmacy change [facility] [address]

## 2021-05-27 NOTE — Telephone Encounter (Signed)
Ok for Tamiflu 75mg  1 tab daily x10 days, #10, no refills

## 2021-05-28 NOTE — Telephone Encounter (Signed)
Called and notified patient per pcp of liquid Tamiflu been sent to pharmacy requested. Patient understood. No further concerns at this time.

## 2021-07-12 ENCOUNTER — Encounter: Payer: Self-pay | Admitting: Family Medicine

## 2021-07-12 ENCOUNTER — Ambulatory Visit (INDEPENDENT_AMBULATORY_CARE_PROVIDER_SITE_OTHER): Payer: BC Managed Care – PPO | Admitting: Family Medicine

## 2021-07-12 VITALS — BP 106/72 | HR 60 | Temp 97.6°F | Ht 63.0 in | Wt 154.0 lb

## 2021-07-12 DIAGNOSIS — E559 Vitamin D deficiency, unspecified: Secondary | ICD-10-CM | POA: Diagnosis not present

## 2021-07-12 DIAGNOSIS — E785 Hyperlipidemia, unspecified: Secondary | ICD-10-CM | POA: Diagnosis not present

## 2021-07-12 DIAGNOSIS — Z0000 Encounter for general adult medical examination without abnormal findings: Secondary | ICD-10-CM

## 2021-07-12 LAB — LIPID PANEL
Cholesterol: 229 mg/dL — ABNORMAL HIGH (ref 0–200)
HDL: 94.3 mg/dL (ref >39.00)
LDL Cholesterol: 120 mg/dL — ABNORMAL HIGH (ref 0–99)
NonHDL: 134.48
Total CHOL/HDL Ratio: 2
Triglycerides: 72 mg/dL (ref 0.0–149.0)
VLDL: 14.4 mg/dL (ref 0.0–40.0)

## 2021-07-12 LAB — CBC WITH DIFFERENTIAL/PLATELET
Basophils Absolute: 0 10*3/uL (ref 0.0–0.1)
Basophils Relative: 1.3 % (ref 0.0–3.0)
Eosinophils Absolute: 0.1 10*3/uL (ref 0.0–0.7)
Eosinophils Relative: 2.9 % (ref 0.0–5.0)
HCT: 38 % (ref 36.0–46.0)
Hemoglobin: 12.7 g/dL (ref 12.0–15.0)
Lymphocytes Relative: 57.7 % — ABNORMAL HIGH (ref 12.0–46.0)
Lymphs Abs: 1.9 10*3/uL (ref 0.7–4.0)
MCHC: 33.4 g/dL (ref 30.0–36.0)
MCV: 94.2 fl (ref 78.0–100.0)
Monocytes Absolute: 0.3 10*3/uL (ref 0.1–1.0)
Monocytes Relative: 10.1 % (ref 3.0–12.0)
Neutro Abs: 0.9 10*3/uL — ABNORMAL LOW (ref 1.4–7.7)
Neutrophils Relative %: 28 % — ABNORMAL LOW (ref 43.0–77.0)
Platelets: 279 10*3/uL (ref 150.0–400.0)
RBC: 4.04 Mil/uL (ref 3.87–5.11)
RDW: 12.8 % (ref 11.5–15.5)
WBC: 3.3 10*3/uL — ABNORMAL LOW (ref 4.0–10.5)

## 2021-07-12 LAB — BASIC METABOLIC PANEL
BUN: 17 mg/dL (ref 6–23)
CO2: 29 mEq/L (ref 19–32)
Calcium: 9.3 mg/dL (ref 8.4–10.5)
Chloride: 102 mEq/L (ref 96–112)
Creatinine, Ser: 0.78 mg/dL (ref 0.40–1.20)
GFR: 96.37 mL/min (ref >60.00)
Glucose, Bld: 78 mg/dL (ref 70–99)
Potassium: 4 mEq/L (ref 3.5–5.1)
Sodium: 137 mEq/L (ref 135–145)

## 2021-07-12 LAB — HEPATIC FUNCTION PANEL
ALT: 20 U/L (ref 0–35)
AST: 72 U/L — ABNORMAL HIGH (ref 0–37)
Albumin: 4.3 g/dL (ref 3.5–5.2)
Alkaline Phosphatase: 38 U/L — ABNORMAL LOW (ref 39–117)
Bilirubin, Direct: 0.1 mg/dL (ref 0.0–0.3)
Total Bilirubin: 0.7 mg/dL (ref 0.2–1.2)
Total Protein: 6.8 g/dL (ref 6.0–8.3)

## 2021-07-12 LAB — TSH: TSH: 0.96 u[IU]/mL (ref 0.35–5.50)

## 2021-07-12 MED ORDER — TYPHOID VACCINE PO CPDR: 1.0000 | DELAYED_RELEASE_CAPSULE | ORAL | 0 refills | Status: DC

## 2021-07-12 NOTE — Progress Notes (Signed)
° °  Subjective:    Patient ID: Alexandra Bradley, female    DOB: 1980-10-29, 40 y.o.   MRN: [id number]  HPI CPE- UTD on Tdap.  Pap done 10/28/19 at [facility].  UTD on flu shot.  Patient Care Team    Relationship Specialty Notifications Start End  Sheliah Hatch, MD PCP - General Family Medicine  08/18/17   Mitchel Honour, DO Consulting Physician Obstetrics and Gynecology  08/18/17     Health Maintenance  Topic Date Due   Hepatitis C Screening  Never done   PAP SMEAR-Modifier  08/19/2019   COVID-19 Vaccine (4 - Booster) 07/13/2020   INFLUENZA VACCINE  02/22/2021   TETANUS/TDAP  08/07/2022   HIV Screening  Completed   Pneumococcal Vaccine 43-58 Years old  Aged Out   HPV VACCINES  Aged Out      Review of Systems Patient reports no vision/ hearing changes, adenopathy,fever, persistant/recurrent hoarseness , swallowing issues, chest pain, palpitations, edema, persistant/recurrent cough, hemoptysis, dyspnea (rest/exertional/paroxysmal nocturnal), gastrointestinal bleeding (melena, rectal bleeding), abdominal pain, significant heartburn, bowel changes, GU symptoms (dysuria, hematuria, incontinence), Gyn symptoms (abnormal  bleeding, pain),  syncope, focal weakness, memory loss, numbness & tingling, skin/hair/nail changes, abnormal bruising or bleeding, anxiety, or depression.   + 9 lb weight gain   This visit occurred during the SARS-CoV-2 public health emergency.  Safety protocols were in place, including screening questions prior to the visit, additional usage of staff PPE, and extensive cleaning of exam room while observing appropriate contact time as indicated for disinfecting solutions.      Objective:   Physical Exam General Appearance:    Alert, cooperative, no distress, appears stated age  Head:    Normocephalic, without obvious abnormality, atraumatic  Eyes:    PERRL, conjunctiva/corneas clear, EOM's intact, fundi    benign, both eyes  Ears:    Normal TM's and external  ear canals, both ears  Nose:   Deferred due to COVID  Throat:   Neck:   Supple, symmetrical, trachea midline, no adenopathy;    Thyroid: no enlargement/tenderness/nodules  Back:     Symmetric, no curvature, ROM normal, no CVA tenderness  Lungs:     Clear to auscultation bilaterally, respirations unlabored  Chest Wall:    No tenderness or deformity   Heart:    Regular rate and rhythm, S1 and S2 normal, no murmur, rub   or gallop  Breast Exam:    Deferred to GYN  Abdomen:     Soft, non-tender, bowel sounds active all four quadrants,    no masses, no organomegaly  Genitalia:    Deferred to GYN  Rectal:    Extremities:   Extremities normal, atraumatic, no cyanosis or edema  Pulses:   2+ and symmetric all extremities  Skin:   Skin color, texture, turgor normal, no rashes or lesions  Lymph nodes:   Cervical, supraclavicular, and axillary nodes normal  Neurologic:   CNII-XII intact, normal strength, sensation and reflexes    throughout          Assessment & Plan:

## 2021-07-12 NOTE — Assessment & Plan Note (Signed)
Check labs and replete prn. 

## 2021-07-12 NOTE — Assessment & Plan Note (Signed)
Pt's PE WNL.  UTD on pap, Tdap, flu, COVID.  Check labs.  Anticipatory guidance provided.  °

## 2021-07-12 NOTE — Patient Instructions (Signed)
Follow up in 1 year or as needed We'll notify you of your lab results and make any changes if needed Continue to work on healthy diet and regular exercise- you look great!! Take the Typhoid pills at your convenience but prior to travel Call with any questions or concerns Stay Safe!  Stay Healthy! Happy Holidays!!!

## 2021-07-12 NOTE — Assessment & Plan Note (Signed)
Ongoing issue.  Attempting to control w/ diet and exercise.  Check labs and determine if meds are needed.

## 2021-07-13 LAB — VITAMIN D 25 HYDROXY (VIT D DEFICIENCY, FRACTURES): VITD: 32.77 ng/mL (ref 30.00–100.00)

## 2021-07-15 ENCOUNTER — Telehealth: Payer: Self-pay

## 2021-07-15 NOTE — Telephone Encounter (Signed)
-----   Message from Sheliah Hatch, MD sent at 07/13/2021  7:42 AM EST ----- Labs look good w/ exception of mildly elevated AST (liver enzyme).  Please hold tylenol and alcohol x2 weeks and we will repeat your liver functions at a lab only visit

## 2021-07-15 NOTE — Telephone Encounter (Signed)
Spoke to patient, scheduled lab visit only in 2 weeks.

## 2021-08-02 ENCOUNTER — Encounter: Payer: Self-pay | Admitting: Family Medicine

## 2021-08-02 ENCOUNTER — Telehealth (INDEPENDENT_AMBULATORY_CARE_PROVIDER_SITE_OTHER): Payer: BC Managed Care – PPO | Admitting: Family Medicine

## 2021-08-02 ENCOUNTER — Other Ambulatory Visit: Payer: Self-pay

## 2021-08-02 ENCOUNTER — Other Ambulatory Visit (INDEPENDENT_AMBULATORY_CARE_PROVIDER_SITE_OTHER): Payer: BC Managed Care – PPO

## 2021-08-02 VITALS — Temp 97.2°F | Ht 63.0 in | Wt 144.0 lb

## 2021-08-02 DIAGNOSIS — R7401 Elevation of levels of liver transaminase levels: Secondary | ICD-10-CM | POA: Diagnosis not present

## 2021-08-02 DIAGNOSIS — R051 Acute cough: Secondary | ICD-10-CM

## 2021-08-02 DIAGNOSIS — U071 COVID-19: Secondary | ICD-10-CM

## 2021-08-02 LAB — HEPATIC FUNCTION PANEL
ALT: 12 U/L (ref 0–35)
AST: 20 U/L (ref 0–37)
Albumin: 4.4 g/dL (ref 3.5–5.2)
Alkaline Phosphatase: 35 U/L — ABNORMAL LOW (ref 39–117)
Bilirubin, Direct: 0.1 mg/dL (ref 0.0–0.3)
Total Bilirubin: 1.1 mg/dL (ref 0.2–1.2)
Total Protein: 6.8 g/dL (ref 6.0–8.3)

## 2021-08-02 MED ORDER — BENZONATATE 200 MG PO CAPS: 200.0000 mg | ORAL_CAPSULE | Freq: Three times a day (TID) | ORAL | 1 refills | Status: DC | PRN

## 2021-08-02 MED ORDER — GUAIFENESIN-CODEINE 100-10 MG/5ML PO SOLN: 5.0000 mL | Freq: Four times a day (QID) | ORAL | 0 refills | Status: DC | PRN

## 2021-08-02 MED ORDER — NIRMATRELVIR/RITONAVIR (PAXLOVID)TABLET: 3.0000 | ORAL_TABLET | Freq: Two times a day (BID) | ORAL | 0 refills | Status: AC

## 2021-08-02 NOTE — Addendum Note (Signed)
Addended by: Lenard Simmer on: 08/02/2021 08:08 AM   Modules accepted: Orders

## 2021-08-02 NOTE — Progress Notes (Signed)
Alexandra Raspberry T. Alizzon Dioguardi, MD Primary Care and [place] at [facility] [place], [address] Phone: [phone]   FAX: [place] - 41 y.o. female   MRN [id number]   Date of Birth: 1980/12/20  Visit Date: 08/02/2021   PCP: Midge Minium, MD   Referred by: Midge Minium, MD  Virtual Visit via Video Note:  I connected with  Alexandra Bradley on 08/02/2021  2:00 PM EST by a video enabled telemedicine application and verified that I am speaking with the correct person using two identifiers.   Location patient: home computer, tablet, or smartphone Location provider: work or home office Consent: Verbal consent directly obtained from [employer]. Persons participating in the virtual visit: patient, provider  I discussed the limitations of evaluation and management by telemedicine and the availability of in person appointments. The patient expressed understanding and agreed to proceed.  Chief Complaint  Patient presents with   Covid Positive    Positive home test this morning. Symptoms started last night   Scratchy Throat   Fatigue   Cough    Mild    History of Present Illness:  She started to feel sick yesterday with some scratchy throat, cough, generalized fatigue.  This morning she did test positive for COVID-19.  She denies any GI symptoms, nauseousness, vomiting, diarrhea.  She does not have a fever, she is eating and drinking normally.  Primary issues right now her throat and coughing.  She has tried some Delsym, this has not really helped all that much.  Tested positive thi morning Throat and coughing    Review of Systems as above: See pertinent positives and pertinent negatives per HPI No acute distress verbally   Observations/Objective/Exam:  An attempt was made to discern vital signs over the phone and per patient if applicable and possible.   General:    Alert, Oriented, appears well  and in no acute distress  Pulmonary:     On inspection no signs of respiratory distress.  Psych / Neurological:     Pleasant and cooperative.  Assessment and Plan:    ICD-10-CM   1. COVID-19  U07.1 nirmatrelvir/ritonavir EUA (PAXLOVID) 20 x 150 MG & 10 x 100MG  TABS    2. Acute cough  R05.1 benzonatate (TESSALON) 200 MG capsule    guaiFENesin-codeine 100-10 MG/5ML syrup     Reviewed basic supportive care.  Continue to push fluids and start Paxlovid.  We reviewed isolation criteria.  I discussed the assessment and treatment plan with the patient. The patient was provided an opportunity to ask questions and all were answered. The patient agreed with the plan and demonstrated an understanding of the instructions.   The patient was advised to call back or seek an in-person evaluation if the symptoms worsen or if the condition fails to improve as anticipated.  Follow-up: prn unless noted otherwise below No follow-ups on file.  Meds ordered this encounter  Medications   nirmatrelvir/ritonavir EUA (PAXLOVID) 20 x 150 MG & 10 x 100MG  TABS    Sig: Take 3 tablets by mouth 2 (two) times daily for 5 days. (Take nirmatrelvir 150 mg two tablets twice daily for 5 days and ritonavir 100 mg one tablet twice daily for 5 days) Patient GFR is 97.    Dispense:  30 tablet    Refill:  0   benzonatate (TESSALON) 200 MG capsule    Sig: Take 1 capsule (  200 mg total) by mouth 3 (three) times daily as needed for cough.    Dispense:  40 capsule    Refill:  1   guaiFENesin-codeine 100-10 MG/5ML syrup    Sig: Take 5 mLs by mouth every 6 (six) hours as needed for cough.    Dispense:  120 mL    Refill:  0   No orders of the defined types were placed in this encounter.   Signed,  Maud Deed. Borna Wessinger, MD

## 2021-09-08 ENCOUNTER — Telehealth: Payer: Self-pay | Admitting: Family Medicine

## 2021-09-08 MED ORDER — ATOVAQUONE-PROGUANIL HCL 250-100 MG PO TABS: ORAL_TABLET | ORAL | 0 refills | Status: DC

## 2021-09-08 NOTE — Telephone Encounter (Signed)
Patient called and made aware.

## 2021-09-08 NOTE — Telephone Encounter (Signed)
Pt called in stating that she is leaving to to go to [place] tomorrow. She states that at her last appt she and Beverely Low talked about the Malarone script. She states that the area she is going to be in it is recommended by the cdc to take the malarone medication.   Please advise if this is ok pt would like to pick this up today.

## 2021-09-08 NOTE — Telephone Encounter (Signed)
Prescription sent to [facility] and pt needs to start medication either tonight or tomorrow morning

## 2021-09-08 NOTE — Telephone Encounter (Signed)
Pt states that she will be gone for 2/16-2/26.

## 2021-12-22 DIAGNOSIS — Z01419 Encounter for gynecological examination (general) (routine) without abnormal findings: Secondary | ICD-10-CM | POA: Diagnosis not present

## 2021-12-22 DIAGNOSIS — Z6827 Body mass index (BMI) 27.0-27.9, adult: Secondary | ICD-10-CM | POA: Diagnosis not present

## 2022-02-21 DIAGNOSIS — Z1231 Encounter for screening mammogram for malignant neoplasm of breast: Secondary | ICD-10-CM | POA: Diagnosis not present

## 2022-03-02 ENCOUNTER — Encounter: Payer: Self-pay | Admitting: Emergency Medicine

## 2022-03-02 ENCOUNTER — Ambulatory Visit
Admission: EM | Admit: 2022-03-02 | Discharge: 2022-03-02 | Disposition: A | Discharge status: Home / Self Care | Payer: BC Managed Care – PPO | Attending: Urgent Care | Admitting: Urgent Care

## 2022-03-02 DIAGNOSIS — J069 Acute upper respiratory infection, unspecified: Secondary | ICD-10-CM

## 2022-03-02 DIAGNOSIS — H938X2 Other specified disorders of left ear: Secondary | ICD-10-CM | POA: Diagnosis not present

## 2022-03-02 DIAGNOSIS — H6123 Impacted cerumen, bilateral: Secondary | ICD-10-CM | POA: Diagnosis not present

## 2022-03-02 DIAGNOSIS — R0981 Nasal congestion: Secondary | ICD-10-CM | POA: Diagnosis not present

## 2022-03-02 MED ORDER — AMOXICILLIN 875 MG PO TABS: 875.0000 mg | ORAL_TABLET | Freq: Two times a day (BID) | ORAL | 0 refills | Status: DC

## 2022-03-02 MED ORDER — CARBAMIDE PEROXIDE 6.5 % OT SOLN: 5.0000 [drp] | Freq: Every day | OTIC | 0 refills | Status: DC | PRN

## 2022-03-02 NOTE — ED Notes (Signed)
Pt unable to tolerate ear wax removal due to severe pain when wax was loosened.

## 2022-03-02 NOTE — ED Triage Notes (Signed)
Pt here with congestion and headaches starting 5 days ago and left ear fullness and pain since today.

## 2022-03-02 NOTE — ED Provider Notes (Signed)
[facility]   MRN: [id number] DOB: [birth year]  Subjective:   Alexandra Bradley is a 41 y.o. female presenting for 5-day history of persistent sinus congestion sinus headaches, postnasal drainage.  She started to have more left ear fullness and discomfort today.  Wanted to make sure she did not have an ear infection.  Patient does have a history of cerumen impaction and has at times had to clear her ears out with an ear lavage.  Has used Debrox in the past but not currently.  She did a COVID test that was negative.  Has been taking Zyrtec and Sudafed.  No current facility-administered medications for this encounter.  Current Outpatient Medications:    Ascorbic Acid (VITAMIN C) 100 MG CHEW, Vitamin C, Disp: , Rfl:    atovaquone-proguanil (MALARONE) 250-100 MG TABS tablet, 1 tab by mouth daily starting 1-2 days prior to travel and continuing for 7 days after return, Disp: 20 tablet, Rfl: 0   benzonatate (TESSALON) 200 MG capsule, Take 1 capsule (200 mg total) by mouth 3 (three) times daily as needed for cough., Disp: 40 capsule, Rfl: 1   Cholecalciferol (VITAMIN D) 50 MCG (2000 UT) tablet, Take 2,000 Units by mouth daily., Disp: , Rfl:    guaiFENesin-codeine 100-10 MG/5ML syrup, Take 5 mLs by mouth every 6 (six) hours as needed for cough., Disp: 120 mL, Rfl: 0   Multiple Vitamins-Minerals (MULTIVITAMIN ADULT EXTRA C PO), multivitamin, Disp: , Rfl:    Probiotic Product (PROBIOTIC PO), Take 1 tablet by mouth daily., Disp: , Rfl:    typhoid (VIVOTIF) DR capsule, Take 1 capsule by mouth every other day. (Patient not taking: Reported on 08/02/2021), Disp: 4 capsule, Rfl: 0   valACYclovir (VALTREX) 1000 MG tablet, Take 1,000 mg by mouth daily., Disp: , Rfl:    No Known Allergies  Past Medical History:  Diagnosis Date   Chicken pox    Hyperlipidemia      Past Surgical History:  Procedure Laterality Date   breast biopsy  2005 and 2006   BREAST BIOPSY      Family History   Problem Relation Age of Onset   Arthritis Mother    Hyperlipidemia Mother    Hypertension Mother    Arthritis Father    Hyperlipidemia Father    Hypertension Father    Diabetes Father    Arthritis Maternal Grandmother    Cancer Sister        breast   Hyperlipidemia Sister    Breast cancer Sister    Hyperlipidemia Brother     Social History   Tobacco Use   Smoking status: Never   Smokeless tobacco: Never  Vaping Use   Vaping Use: Never used  Substance Use Topics   Alcohol use: No   Drug use: No    ROS   Objective:   Vitals: BP 122/82   Pulse 70   Temp 98.9 F (37.2 C)   Resp 20   SpO2 98%   Physical Exam Constitutional:      General: She is not in acute distress.    Appearance: Normal appearance. She is well-developed and normal weight. She is not ill-appearing, toxic-appearing or diaphoretic.  HENT:     Head: Normocephalic and atraumatic.     Right Ear: External ear normal. No drainage or tenderness. No middle ear effusion. There is impacted cerumen. Tympanic membrane is not erythematous.     Left Ear: External ear normal. No drainage or tenderness.  No middle  ear effusion. There is impacted cerumen. Tympanic membrane is not erythematous.     Nose: Congestion present. No rhinorrhea.     Mouth/Throat:     Mouth: Mucous membranes are moist. No oral lesions.     Pharynx: No pharyngeal swelling, oropharyngeal exudate, posterior oropharyngeal erythema or uvula swelling.     Tonsils: No tonsillar exudate or tonsillar abscesses.  Eyes:     General: No scleral icterus.       Right eye: No discharge.        Left eye: No discharge.     Extraocular Movements: Extraocular movements intact.     Right eye: Normal extraocular motion.     Left eye: Normal extraocular motion.     Conjunctiva/sclera: Conjunctivae normal.  Cardiovascular:     Rate and Rhythm: Normal rate and regular rhythm.     Heart sounds: Normal heart sounds. No murmur heard.    No friction rub. No  gallop.  Pulmonary:     Effort: Pulmonary effort is normal. No respiratory distress.     Breath sounds: No stridor. No wheezing, rhonchi or rales.  Chest:     Chest wall: No tenderness.  Musculoskeletal:     Cervical back: Normal range of motion and neck supple.  Lymphadenopathy:     Cervical: No cervical adenopathy.  Skin:    General: Skin is warm and dry.  Neurological:     General: No focal deficit present.     Mental Status: She is alert and oriented to person, place, and time.  Psychiatric:        Mood and Affect: Mood normal.        Behavior: Behavior normal.    Ear lavage performed using mixture of peroxide and water.  Pressure irrigation performed using a bottle and a thin ear tube.  Left ear lavage.  Curette was used for manual removal.   Assessment and Plan :   PDMP not reviewed this encounter.  1. Viral upper respiratory infection   2. Ear fullness, left   3. Bilateral impacted cerumen   4. Nasal congestion    Deferred repeat COVID test.  Recommended management for a viral upper respiratory infection.  Use supportive care.  Patient did not tolerate the ear lavage and we were only able to do 1 side.  She had continued cerumen impaction.  I advised that she use Debrox, follow-up with ENT. Counseled patient on potential for adverse effects with medications prescribed/recommended today, ER and return-to-clinic precautions discussed, patient verbalized understanding.    Wallis Bamberg, [place] 03/02/22 [id number]

## 2022-03-29 DIAGNOSIS — H6121 Impacted cerumen, right ear: Secondary | ICD-10-CM | POA: Diagnosis not present

## 2022-07-13 ENCOUNTER — Encounter: Payer: BC Managed Care – PPO | Admitting: Family Medicine

## 2022-08-12 ENCOUNTER — Encounter: Payer: Self-pay | Admitting: Family Medicine

## 2022-08-12 ENCOUNTER — Ambulatory Visit (INDEPENDENT_AMBULATORY_CARE_PROVIDER_SITE_OTHER): Payer: BC Managed Care – PPO | Admitting: Family Medicine

## 2022-08-12 VITALS — BP 118/76 | HR 55 | Temp 98.4°F | Resp 17 | Ht 63.0 in | Wt 157.5 lb

## 2022-08-12 DIAGNOSIS — E663 Overweight: Secondary | ICD-10-CM | POA: Diagnosis not present

## 2022-08-12 DIAGNOSIS — Z0000 Encounter for general adult medical examination without abnormal findings: Secondary | ICD-10-CM | POA: Diagnosis not present

## 2022-08-12 DIAGNOSIS — E559 Vitamin D deficiency, unspecified: Secondary | ICD-10-CM | POA: Diagnosis not present

## 2022-08-12 LAB — CBC WITH DIFFERENTIAL/PLATELET
Basophils Absolute: 0.1 10*3/uL (ref 0.0–0.1)
Basophils Relative: 0.9 % (ref 0.0–3.0)
Eosinophils Absolute: 0.1 10*3/uL (ref 0.0–0.7)
Eosinophils Relative: 2 % (ref 0.0–5.0)
HCT: 37.3 % (ref 36.0–46.0)
Hemoglobin: 12.9 g/dL (ref 12.0–15.0)
Lymphocytes Relative: 35.3 % (ref 12.0–46.0)
Lymphs Abs: 2.1 10*3/uL (ref 0.7–4.0)
MCHC: 34.6 g/dL (ref 30.0–36.0)
MCV: 93 fl (ref 78.0–100.0)
Monocytes Absolute: 0.6 10*3/uL (ref 0.1–1.0)
Monocytes Relative: 9.5 % (ref 3.0–12.0)
Neutro Abs: 3 10*3/uL (ref 1.4–7.7)
Neutrophils Relative %: 52.3 % (ref 43.0–77.0)
Platelets: 286 10*3/uL (ref 150.0–400.0)
RBC: 4.01 Mil/uL (ref 3.87–5.11)
RDW: 12.9 % (ref 11.5–15.5)
WBC: 5.8 10*3/uL (ref 4.0–10.5)

## 2022-08-12 LAB — BASIC METABOLIC PANEL
BUN: 16 mg/dL (ref 6–23)
CO2: 26 mEq/L (ref 19–32)
Calcium: 9.3 mg/dL (ref 8.4–10.5)
Chloride: 104 mEq/L (ref 96–112)
Creatinine, Ser: 0.99 mg/dL (ref 0.40–1.20)
GFR: 71.84 mL/min (ref >60.00)
Glucose, Bld: 80 mg/dL (ref 70–99)
Potassium: 4.4 mEq/L (ref 3.5–5.1)
Sodium: 137 mEq/L (ref 135–145)

## 2022-08-12 LAB — HEPATIC FUNCTION PANEL
ALT: 13 U/L (ref 0–35)
AST: 18 U/L (ref 0–37)
Albumin: 4.3 g/dL (ref 3.5–5.2)
Alkaline Phosphatase: 36 U/L — ABNORMAL LOW (ref 39–117)
Bilirubin, Direct: 0.1 mg/dL (ref 0.0–0.3)
Total Bilirubin: 0.4 mg/dL (ref 0.2–1.2)
Total Protein: 6.9 g/dL (ref 6.0–8.3)

## 2022-08-12 LAB — LIPID PANEL
Cholesterol: 208 mg/dL — ABNORMAL HIGH (ref 0–200)
HDL: 81.3 mg/dL (ref >39.00)
LDL Cholesterol: 111 mg/dL — ABNORMAL HIGH (ref 0–99)
NonHDL: 127.1
Total CHOL/HDL Ratio: 3
Triglycerides: 83 mg/dL (ref 0.0–149.0)
VLDL: 16.6 mg/dL (ref 0.0–40.0)

## 2022-08-12 LAB — VITAMIN D 25 HYDROXY (VIT D DEFICIENCY, FRACTURES): VITD: 33.78 ng/mL (ref 30.00–100.00)

## 2022-08-12 LAB — TSH: TSH: 0.85 u[IU]/mL (ref 0.35–5.50)

## 2022-08-12 NOTE — Progress Notes (Signed)
   Subjective:    Patient ID: Alexandra Bradley, female    DOB: Sep 16, 1980, 42 y.o.   MRN: [id number]  HPI CPE- UTD on pap, Tdap, flu.  No concerns today  Patient Care Team    Relationship Specialty Notifications Start End  Midge Minium, MD PCP - General Family Medicine  08/18/17   Linda Hedges, DO Consulting Physician Obstetrics and Gynecology  08/18/17     Health Maintenance  Topic Date Due   PAP SMEAR-Modifier  10/28/2022   DTaP/Tdap/Td (3 - Td or Tdap) 11/11/2025   INFLUENZA VACCINE  Completed   HIV Screening  Completed   HPV VACCINES  Aged Out   COVID-19 Vaccine  Discontinued   Hepatitis C Screening  Discontinued      Review of Systems Patient reports no vision/ hearing changes, adenopathy,fever,  persistant/recurrent hoarseness , swallowing issues, chest pain, palpitations, edema, persistant/recurrent cough, hemoptysis, dyspnea (rest/exertional/paroxysmal nocturnal), gastrointestinal bleeding (melena, rectal bleeding), abdominal pain, significant heartburn, bowel changes, GU symptoms (dysuria, hematuria, incontinence), Gyn symptoms (abnormal  bleeding, pain),  syncope, focal weakness, memory loss, numbness & tingling, skin/hair/nail changes, abnormal bruising or bleeding, anxiety, or depression.   + 13 lb weight gain    Objective:   Physical Exam General Appearance:    Alert, cooperative, no distress, appears stated age  Head:    Normocephalic, without obvious abnormality, atraumatic  Eyes:    PERRL, conjunctiva/corneas clear, EOM's intact both eyes  Ears:    Normal TM's and external ear canals, both ears  Nose:   Nares normal, septum midline, mucosa normal, no drainage    or sinus tenderness  Throat:   Lips, mucosa, and tongue normal; teeth and gums normal  Neck:   Supple, symmetrical, trachea midline, no adenopathy;    Thyroid: no enlargement/tenderness/nodules  Back:     Symmetric, no curvature, ROM normal, no CVA tenderness  Lungs:     Clear to auscultation  bilaterally, respirations unlabored  Chest Wall:    No tenderness or deformity   Heart:    Regular rate and rhythm, S1 and S2 normal, no murmur, rub   or gallop  Breast Exam:    Deferred to GYN  Abdomen:     Soft, non-tender, bowel sounds active all four quadrants,    no masses, no organomegaly  Genitalia:    Deferred to GYN  Rectal:    Extremities:   Extremities normal, atraumatic, no cyanosis or edema  Pulses:   2+ and symmetric all extremities  Skin:   Skin color, texture, turgor normal, no rashes or lesions  Lymph nodes:   Cervical, supraclavicular, and axillary nodes normal  Neurologic:   CNII-XII intact, normal strength, sensation and reflexes    throughout          Assessment & Plan:

## 2022-08-12 NOTE — Assessment & Plan Note (Signed)
Pt's PE WNL w/ exception of BMI.  UTD on pap, Tdap, flu.  Check labs.  Anticipatory guidance provided.  

## 2022-08-12 NOTE — Assessment & Plan Note (Signed)
Check labs and replete prn. 

## 2022-08-12 NOTE — Patient Instructions (Signed)
Follow up in 1 year or as needed We'll notify you of your lab results and make any changes if needed Continue to work on healthy diet and regular exercise- you can do it! Stay Safe!  Stay Healthy! Happy New Year!

## 2023-01-03 ENCOUNTER — Encounter: Payer: Self-pay | Admitting: Obstetrics & Gynecology

## 2023-02-23 ENCOUNTER — Other Ambulatory Visit: Payer: Self-pay | Admitting: Oncology

## 2023-02-23 DIAGNOSIS — Z006 Encounter for examination for normal comparison and control in clinical research program: Secondary | ICD-10-CM

## 2023-04-13 ENCOUNTER — Other Ambulatory Visit (HOSPITAL_BASED_OUTPATIENT_CLINIC_OR_DEPARTMENT_OTHER): Payer: Self-pay

## 2023-05-05 ENCOUNTER — Encounter (HOSPITAL_BASED_OUTPATIENT_CLINIC_OR_DEPARTMENT_OTHER): Payer: Self-pay | Admitting: Student

## 2023-05-05 ENCOUNTER — Ambulatory Visit (HOSPITAL_BASED_OUTPATIENT_CLINIC_OR_DEPARTMENT_OTHER): Payer: BC Managed Care – PPO

## 2023-05-05 ENCOUNTER — Other Ambulatory Visit (HOSPITAL_BASED_OUTPATIENT_CLINIC_OR_DEPARTMENT_OTHER): Payer: Self-pay

## 2023-05-05 ENCOUNTER — Ambulatory Visit (HOSPITAL_BASED_OUTPATIENT_CLINIC_OR_DEPARTMENT_OTHER): Payer: BC Managed Care – PPO | Admitting: Student

## 2023-05-05 DIAGNOSIS — M25551 Pain in right hip: Secondary | ICD-10-CM | POA: Diagnosis not present

## 2023-05-05 DIAGNOSIS — M545 Low back pain, unspecified: Secondary | ICD-10-CM | POA: Diagnosis not present

## 2023-05-05 DIAGNOSIS — I878 Other specified disorders of veins: Secondary | ICD-10-CM | POA: Diagnosis not present

## 2023-05-05 MED ORDER — MELOXICAM 15 MG PO TABS
15.0000 mg | ORAL_TABLET | Freq: Every day | ORAL | 0 refills | Status: AC
  Filled 2023-05-05: qty 14, 14d supply, fill #0

## 2023-05-05 NOTE — Progress Notes (Signed)
Chief Complaint: Right hip pain     History of Present Illness:    Alexandra Bradley is a 42 y.o. female presenting today with chief complaint of right hip pain.  Pain began earlier this week without any known injury.  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  She has run 4 previous half marathons and is also training for her first full marathon in November.  Pain is located in the lateral side of the right hip and only hurts when she walks or runs.  Does not hurt to touch or to lay on her right side.  Pain is moderate in severity and does not radiate.  She also does note some occasional numbness down the left leg.  This is mainly to the calf and does not travel into the foot.  Denies any low back pain.  Surgical History:   None  PMH/PSH/Family History/Social History/Meds/Allergies:    Past Medical History:  Diagnosis Date   Chicken pox    Hyperlipidemia    Past Surgical History:  Procedure Laterality Date   breast biopsy  2005 and 2006   BREAST BIOPSY     Social History   Socioeconomic History   Marital status: Married    Spouse name: Not on file   Number of children: Not on file   Years of education: Not on file   Highest education level: Not on file  Occupational History   Not on file  Tobacco Use   Smoking status: Never   Smokeless tobacco: Never  Vaping Use   Vaping status: Never Used  Substance and Sexual Activity   Alcohol use: No   Drug use: No   Sexual activity: Yes  Other Topics Concern   Not on file  Social History Narrative   Not on file   Social Determinants of Health   Financial Resource Strain: Not on file  Food Insecurity: Not on file  Transportation Needs: Not on file  Physical Activity: Not on file  Stress: Not on file  Social Connections: Not on file   Family History  Problem Relation Age of Onset   Arthritis Mother    Hyperlipidemia Mother    Hypertension Mother     Arthritis Father    Hyperlipidemia Father    Hypertension Father    Diabetes Father    Arthritis Maternal Grandmother    Cancer Sister        breast   Hyperlipidemia Sister    Breast cancer Sister    Hyperlipidemia Brother    No Known Allergies Current Outpatient Medications  Medication Sig Dispense Refill   meloxicam (MOBIC) 15 MG tablet Take 1 tablet (15 mg total) by mouth daily for 14 days. 14 tablet 0   Ascorbic Acid (VITAMIN C) 100 MG CHEW Vitamin C     Cholecalciferol (VITAMIN D) 50 MCG (2000 UT) tablet Take 2,000 Units by mouth daily.     Multiple Vitamin (MULTIVITAMIN) capsule Take 1 capsule by mouth daily.     Multiple Vitamins-Minerals (MULTIVITAMIN ADULT EXTRA C PO) multivitamin     Probiotic Product (PROBIOTIC PO) Take 1 tablet by mouth daily.     valACYclovir (VALTREX) 1000 MG tablet Take 1,000 mg by mouth daily.     No current facility-administered medications for  this visit.   No results found.  Review of Systems:   A ROS was performed including pertinent positives and negatives as documented in the HPI.  Physical Exam :   Constitutional: NAD and appears stated age Neurological: Alert and oriented Psych: Appropriate affect and cooperative There were no vitals taken for this visit.   Comprehensive Musculoskeletal Exam:    Passive range of motion of the right hip to 120 degrees flexion, 30 degrees external rotation, and 20 degrees internal rotation.  Negative FABER and FADIR bilaterally.  No tenderness palpation over the greater trochanter.  No tenderness to palpation of the lumbar spine.  Imaging:   Xray (right hip 3 views, AP pelvis): Bilateral pincer lesions.  Otherwise negative.  Xray (lumbar spine 4 views): Negative   I personally reviewed and interpreted the radiographs.   Assessment:   42 y.o. female with lateral right hip pain.  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  I would like to start by getting her in for some physical therapy for further assessment and strengthening.  Her goal is to still be able to run in the marathon in mid-November.  I will also send some meloxicam to help with pain and inflammation.  Will plan to see her back in 4 weeks for reassessment.  Should she not improve I will likely consider further evaluation with MRI to rule out a tear or stress fracture.  Plan :    -Referral to physical therapy and return to clinic in 4 weeks for follow-up -Start meloxicam 15 mg     I personally saw and evaluated the patient, and participated in the management and treatment plan.  Hazle Nordmann, PA-C Orthopedics

## 2023-05-08 NOTE — Therapy (Signed)
OUTPATIENT PHYSICAL THERAPY EVALUATION   Patient Name: Alexandra Bradley MRN: [id number] DOB:06-16-1981, 42 y.o., female Today's Date: 05/10/2023  END OF SESSION:  PT End of Session - 05/10/23 1148     Visit Number 1    Number of Visits 20    Date for PT Re-Evaluation 07/19/23    Authorization Type [employer] $75 copay 20 visits    Authorization - Visit Number 1    Authorization - Number of Visits 20    Progress Note Due on Visit 10    PT Start Time 1149    PT Stop Time 1228    PT Time Calculation (min) 39 min    Activity Tolerance Patient tolerated treatment well    Behavior During Therapy [facility] for tasks assessed/performed             Past Medical History:  Diagnosis Date   Chicken pox    Hyperlipidemia    Past Surgical History:  Procedure Laterality Date   breast biopsy  2005 and 2006   BREAST BIOPSY     Patient Active Problem List   Diagnosis Date Noted   Genital herpes simplex 10/28/2019   Needle stick, hypodermic, accidental 06/14/2019   Vitamin D deficiency 08/18/2017   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   Hyperlipidemia 10/12/2012   Routine general medical examination at a health care facility 10/12/2012    PCP: Sheliah Hatch MD  REFERRING PROVIDER: Amador Cunas, PA-C  REFERRING DIAG: [phone] (ICD-10-CM) - Pain of right hip  Rationale for Evaluation and Treatment: Rehabilitation  THERAPY DIAG:  Pain in right hip  Pain in right leg  Muscle weakness (generalized)  Difficulty in walking, not elsewhere classified  ONSET DATE: 04/2023  SUBJECTIVE:                                                                                                                                                                                           SUBJECTIVE STATEMENT: Pt indicated she has been training for a marathon and doing more running. Pt indicated last week having complaints of pulling in Rt hip.  Pt indicated not initially noticing it running that  Monday or Tuesday but felt it with walking and other times.  Pt indicated Thursday running had complaints.  Pt indicated plan was to do a half marathon that weekend but didn't due to symptoms.   Pt reported doing heat and OTC medicine.  Chief complaint of a pulling feel that doesn't seem to loosen with stretching.   Saw MD in walk in clinic at [facility] with negative xrays.   Marathon event Nov 16th.  Had progressed to 16  mile run and 40 in a week.  Prior to training for marathon, casual running.   Denied history of Rt leg symptoms in hip.  Has history of bilateral knee (Lt more than Rt) complaints at times.   PERTINENT HISTORY:  Unremarkable   PAIN:  NPRS scale: at worst 3-4 /10 Pain location: Rt hip anterior and lateral hip as well Pain description: pulling/tightness Aggravating factors: walking, running Relieving factors: resting, stretching for short term.   PRECAUTIONS: None  WEIGHT BEARING RESTRICTIONS: No  FALLS:  Has patient fallen in last 6 months? No  LIVING ENVIRONMENT:  Lives in: House/apartment Stairs: stairs at home without pain.    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     OBJECTIVE:   PATIENT SURVEYS:  05/10/2023 FOTO eval:  69  predicted:  87  SCREENING FOR RED FLAGS: 05/10/2023 Bowel or bladder incontinence: No Cauda equina syndrome: No  COGNITION: 05/10/2023 Overall cognitive status: WFL normal      SENSATION: 05/10/2023 WFL  MUSCLE LENGTH: 05/10/2023 (+) Thomas test on Rt, (+) Elys test  POSTURE:  05/10/2023 No Significant postural limitations  PALPATION: 05/10/2023 Trigger points in Rt glute med/min, TFL with concordant symptoms.   LOWER EXTREMITY ROM:      Right 05/10/2023 Left 05/10/2023  Hip flexion [facility]  Hip extension    Hip abduction    Hip adduction    Hip internal rotation [facility]  Hip external rotation [facility]  Knee  flexion    Knee extension    Ankle dorsiflexion    Ankle plantarflexion    Ankle inversion    Ankle eversion     (Blank rows = not tested)  LOWER EXTREMITY MMT:    MMT Right 05/10/2023 Left 05/10/2023  Hip flexion 4/5 c pain 5/5  Hip extension 5/5 5/5  Hip abduction 3+/5 4+/5  Hip adduction    Hip internal rotation    Hip external rotation    Knee flexion 5/5 5/5  Knee extension 5/5 5/5  Ankle dorsiflexion 5/5 5/5  Ankle plantarflexion    Ankle inversion    Ankle eversion     (Blank rows = not tested)   SPECIAL TESTS:  05/10/2023 No specific testing today.    FUNCTIONAL TESTS:  05/10/2023 Lt SLS: 30 seconds s deviation Rt SLS: 30 seconds c mild increase in aberrant movement, increased in lateral hip control deficits 18 inch chair transfer without deviation, no UE required.  Similar in free squat  GAIT: 05/10/2023 Independent ambulation  TODAY'S TREATMENT:                                                                                                         DATE: 05/10/2023  Therex:    HEP instruction/performance c cues for techniques, handout provided.  Trial set performed of each for comprehension and symptom assessment.  See below for exercise list  Manual Compression to Rt glute med  Trigger Point Dry-Needling  Treatment instructions: Expect mild to moderate muscle soreness. S/S of pneumothorax if dry needled over a lung field, and to seek immediate medical attention should they occur. Patient verbalized understanding of these instructions and education.  Patient Consent Given: Yes Education handout provided: Yes Muscles treated: Rt glute med/min Treatment response/outcome: twitch response with poor to fair tolerance in moment.    PATIENT EDUCATION:   05/10/2023 Education details: HEP, POC Person educated: Patient Education method: Programmer, multimedia, Demonstration, Verbal cues, and Handouts Education comprehension: verbalized understanding, returned demonstration, and verbal cues required  HOME EXERCISE PROGRAM: Access Code: [email or web address] URL: [email or web address] Date: 05/10/2023 Prepared by: Chyrel Masson  Exercises - Hip Flexor Stretch at [facility] of Bed  - 1-2 x daily - 7 x weekly - 1 sets - 5 reps - 15 hold - Supine Piriformis Stretch with Foot on Ground  - 2 x daily - 7 x weekly - 1 sets - 5 reps - 30 hold - Figure 4 Bridge  - 1-2 x daily - 7 x weekly - 3 sets - 10 reps - Modified Side Plank with Hip Abduction  - 1 x daily - 7 x weekly - 3 sets - 10 reps - Standing Hip Hiking  - 1-2 x daily - 7 x weekly - 1 sets - 10 reps - 5 hold  ASSESSMENT:  CLINICAL IMPRESSION: Patient is a 42 y.o. who comes to clinic with complaints of Rt hip/thigh pain with mobility, strength and movement coordination deficits that impair their ability to perform usual daily and recreational functional activities without increase difficulty/symptoms at this time.  Patient to benefit from skilled PT services to address impairments and limitations to improve to previous level of function without restriction secondary to condition.   OBJECTIVE IMPAIRMENTS: decreased activity tolerance, decreased balance, decreased coordination, decreased endurance, decreased mobility, difficulty walking, decreased ROM, decreased strength, increased fascial restrictions, impaired perceived functional ability, impaired flexibility, improper body mechanics, and pain.   ACTIVITY LIMITATIONS: lifting, bending, standing, squatting, transfers, and locomotion level  PARTICIPATION LIMITATIONS:  running  PERSONAL FACTORS:  no specific factors  are also affecting patient's functional outcome.   REHAB POTENTIAL: Good  CLINICAL DECISION MAKING: Stable/uncomplicated  EVALUATION  COMPLEXITY: Low   GOALS: Goals reviewed with patient? Yes  SHORT TERM GOALS: (target date for Short term goals are 3 weeks 05/31/2023)  1. Patient will demonstrate independent use of home exercise program to maintain progress from in clinic treatments.  Goal status: New  LONG TERM GOALS: (target dates for all long term goals are 10 weeks  07/19/2023 )   1. Patient will demonstrate/report pain at worst less than or equal  to 2/10 to facilitate minimal limitation in daily activity secondary to pain symptoms.  Goal status: New   2. Patient will demonstrate independent use of home exercise program to facilitate ability to maintain/progress functional gains from skilled physical therapy services.  Goal status: New   3. Patient will demonstrate FOTO outcome > or = 87 % to indicate reduced disability due to condition.  Goal status: New   4. Patient will demonstrate bilateral hip MMT 5/5 throughout to facilitate usual daily and running activity at PLOF.   Goal status: New   5.  Patient will demonstrate Rt SLS 30 seconds s deviation/trendelenburg for equalling Lt.   Goal status: New   6.  Patient will demonstrate return to running s symptoms.  Goal status: New     PLAN:  PT FREQUENCY: 1-2x/week  PT DURATION: 10 weeks  PLANNED INTERVENTIONS: 97146- PT Re-evaluation, 97110-Therapeutic exercises, 97530- Therapeutic activity, [id number]- Neuromuscular re-education, 97535- Self Care, 16109- Manual therapy, [phone]- Gait training, [phone]- Orthotic Fit/training, [id number]- Canalith repositioning, [id number]- Aquatic Therapy, 97014- Electrical stimulation (unattended), [id number]- Electrical stimulation (manual), [id number]- Vasopneumatic device, [id number]- Ultrasound, [id number]- Traction (mechanical), [id number]- Ionotophoresis 4mg /ml Dexamethasone, Patient/Family education, Balance training, Stair training, Taping, Dry Needling, Joint mobilization, Joint manipulation, Spinal manipulation, Spinal mobilization, Scar mobilization,  Vestibular training, Visual/preceptual remediation/compensation, DME instructions, Cryotherapy, and Moist heat.  All preferred as medically necessary.  All included unless contraindicated  PLAN FOR NEXT SESSION: Review HEP knowledge/results.  Dry needling as desired.    Chyrel Masson, PT, DPT, OCS, ATC 05/10/23  1:26 PM

## 2023-05-10 ENCOUNTER — Ambulatory Visit: Payer: BC Managed Care – PPO | Admitting: Rehabilitative and Restorative Service Providers"

## 2023-05-10 ENCOUNTER — Other Ambulatory Visit: Payer: Self-pay

## 2023-05-10 ENCOUNTER — Encounter: Payer: Self-pay | Admitting: Rehabilitative and Restorative Service Providers"

## 2023-05-10 DIAGNOSIS — M25551 Pain in right hip: Secondary | ICD-10-CM

## 2023-05-10 DIAGNOSIS — M6281 Muscle weakness (generalized): Secondary | ICD-10-CM

## 2023-05-10 DIAGNOSIS — M79604 Pain in right leg: Secondary | ICD-10-CM

## 2023-05-10 DIAGNOSIS — R262 Difficulty in walking, not elsewhere classified: Secondary | ICD-10-CM

## 2023-05-17 ENCOUNTER — Other Ambulatory Visit (HOSPITAL_BASED_OUTPATIENT_CLINIC_OR_DEPARTMENT_OTHER): Payer: Self-pay

## 2023-05-17 MED ORDER — FLULAVAL 0.5 ML IM SUSY
0.5000 mL | PREFILLED_SYRINGE | Freq: Once | INTRAMUSCULAR | 0 refills | Status: AC
  Filled 2023-05-17: qty 0.5, 1d supply, fill #0

## 2023-05-18 ENCOUNTER — Ambulatory Visit: Payer: BC Managed Care – PPO | Admitting: Physical Therapy

## 2023-05-26 ENCOUNTER — Encounter: Payer: BC Managed Care – PPO | Admitting: Physical Therapy

## 2023-05-30 ENCOUNTER — Encounter: Payer: Self-pay | Admitting: Rehabilitative and Restorative Service Providers"

## 2023-05-30 ENCOUNTER — Ambulatory Visit: Payer: BC Managed Care – PPO | Admitting: Rehabilitative and Restorative Service Providers"

## 2023-05-30 DIAGNOSIS — M79604 Pain in right leg: Secondary | ICD-10-CM | POA: Diagnosis not present

## 2023-05-30 DIAGNOSIS — M25551 Pain in right hip: Secondary | ICD-10-CM | POA: Diagnosis not present

## 2023-05-30 DIAGNOSIS — R262 Difficulty in walking, not elsewhere classified: Secondary | ICD-10-CM | POA: Diagnosis not present

## 2023-05-30 DIAGNOSIS — M6281 Muscle weakness (generalized): Secondary | ICD-10-CM | POA: Diagnosis not present

## 2023-05-30 NOTE — Therapy (Signed)
OUTPATIENT PHYSICAL THERAPY TREATMENT   Patient Name: Alexandra Bradley MRN: [id number] DOB:September 27, 1980, 42 y.o., female Today's Date: 05/30/2023  END OF SESSION:  PT End of Session - 05/30/23 1146     Visit Number 2    Number of Visits 20    Date for PT Re-Evaluation 07/19/23    Authorization Type [employer] $75 copay 20 visits    Authorization - Visit Number 2    Authorization - Number of Visits 20    Progress Note Due on Visit 10    PT Start Time 1144    PT Stop Time 1214    PT Time Calculation (min) 30 min    Activity Tolerance Patient tolerated treatment well    Behavior During Therapy [facility] for tasks assessed/performed              Past Medical History:  Diagnosis Date   Chicken pox    Hyperlipidemia    Past Surgical History:  Procedure Laterality Date   breast biopsy  2005 and 2006   BREAST BIOPSY     Patient Active Problem List   Diagnosis Date Noted   Genital herpes simplex 10/28/2019   Needle stick, hypodermic, accidental 06/14/2019   Vitamin D deficiency 08/18/2017   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   Hyperlipidemia 10/12/2012   Routine general medical examination at a health care facility 10/12/2012    PCP: Sheliah Hatch MD  REFERRING PROVIDER: Amador Cunas, PA-C  REFERRING DIAG: [phone] (ICD-10-CM) - Pain of right hip  Rationale for Evaluation and Treatment: Rehabilitation  THERAPY DIAG:  Pain in right hip  Pain in right leg  Muscle weakness (generalized)  Difficulty in walking, not elsewhere classified  ONSET DATE: 04/2023  SUBJECTIVE:                                                                                                                                                                                           SUBJECTIVE STATEMENT: Pt indicated having improvement in symptoms since first visit.  Pt indicated at worst up to 3/10.  Reported doing activity but still feeling some with longer 18 miles.   PERTINENT HISTORY:   Unremarkable   PAIN:  NPRS scale: at worst 3-4 /10 Pain location: Rt hip anterior and lateral hip as well Pain description: pulling/tightness Aggravating factors: walking, running Relieving factors: resting, stretching for short term.   PRECAUTIONS: None  WEIGHT BEARING RESTRICTIONS: No  FALLS:  Has patient fallen in last 6 months? No  LIVING ENVIRONMENT:  Lives in: House/apartment Stairs: stairs at home without pain.    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     OBJECTIVE:   PATIENT SURVEYS:  05/30/2023  FOTO:  83  05/10/2023 FOTO eval:  69  predicted:  87  SCREENING FOR RED FLAGS: 05/10/2023 Bowel or bladder incontinence: No Cauda equina syndrome: No  COGNITION: 05/10/2023 Overall cognitive status: WFL normal      SENSATION: 05/10/2023 WFL  MUSCLE LENGTH: 05/10/2023 (+) Thomas test on Rt, (+) Elys test  POSTURE:  05/10/2023 No Significant postural limitations  PALPATION: 05/10/2023 Trigger points in Rt glute med/min, TFL with concordant symptoms.   LOWER EXTREMITY ROM:      Right 05/10/2023 Left 05/10/2023  Hip flexion [facility]  Hip extension    Hip abduction    Hip adduction    Hip internal rotation [facility]  Hip external rotation [facility]  Knee flexion    Knee extension    Ankle dorsiflexion    Ankle plantarflexion    Ankle inversion    Ankle eversion     (Blank rows = not tested)  LOWER EXTREMITY MMT:    MMT Right 05/10/2023 Left 05/10/2023 Right 05/30/2023  Hip flexion 4/5 c pain 5/5 5/5  Hip extension 5/5 5/5   Hip abduction 3+/5 4+/5 4/5  Hip adduction     Hip internal rotation     Hip external rotation     Knee flexion 5/5 5/5   Knee extension 5/5 5/5   Ankle dorsiflexion 5/5 5/5   Ankle plantarflexion     Ankle inversion     Ankle eversion      (Blank rows = not tested)   SPECIAL TESTS:  05/10/2023 No specific testing  today.    FUNCTIONAL TESTS:  05/10/2023 Lt SLS: 30 seconds s deviation Rt SLS: 30 seconds c mild increase in aberrant movement, increased in lateral hip control deficits 18 inch chair transfer without deviation, no UE required.  Similar in free squat  GAIT: 05/10/2023 Independent ambulation                                                                                                                                                                                                                   TODAY'S TREATMENT:  DATE: 05/30/2023  Therex: Verbal review of continued HEP usage as well as progression in reps, holds to challenge further.   Manual Compression to Rt glute med. Percussive device to Rt lateral thigh  Trigger Point Dry-Needling  Treatment instructions: Expect mild to moderate muscle soreness. S/S of pneumothorax if dry needled over a lung field, and to seek immediate medical attention should they occur. Patient verbalized understanding of these instructions and education.  Patient Consent Given: Yes Education handout provided: Yes Muscles treated: Rt glute med/min Treatment response/outcome: twitch response with poor to fair tolerance in moment.   TODAY'S TREATMENT:                                                                                                         DATE: 05/10/2023  Therex:    HEP instruction/performance c cues for techniques, handout provided.  Trial set performed of each for comprehension and symptom assessment.  See below for exercise list  Manual Compression to Rt glute med  Trigger Point Dry-Needling  Treatment instructions: Expect mild to moderate muscle soreness. S/S of pneumothorax if dry needled over a lung field, and to seek immediate medical attention should they occur. Patient verbalized understanding of these instructions and  education.  Patient Consent Given: Yes Education handout provided: Yes Muscles treated: Rt glute med/min Treatment response/outcome: twitch response with poor to fair tolerance in moment.    PATIENT EDUCATION:  05/10/2023 Education details: HEP, POC Person educated: Patient Education method: Programmer, multimedia, Demonstration, Verbal cues, and Handouts Education comprehension: verbalized understanding, returned demonstration, and verbal cues required  HOME EXERCISE PROGRAM: Access Code: [email or web address] URL: [email or web address] Date: 05/10/2023 Prepared by: Chyrel Masson  Exercises - Hip Flexor Stretch at [facility] of Bed  - 1-2 x daily - 7 x weekly - 1 sets - 5 reps - 15 hold - Supine Piriformis Stretch with Foot on Ground  - 2 x daily - 7 x weekly - 1 sets - 5 reps - 30 hold - Figure 4 Bridge  - 1-2 x daily - 7 x weekly - 3 sets - 10 reps - Modified Side Plank with Hip Abduction  - 1 x daily - 7 x weekly - 3 sets - 10 reps - Standing Hip Hiking  - 1-2 x daily - 7 x weekly - 1 sets - 10 reps - 5 hold  ASSESSMENT:  CLINICAL IMPRESSION: Strength improvements noted as well as improved tolerance in running activity.  At this time, continued focus on strengthening in HEP and myofascial release techniques in clinic to help management of symptoms.   OBJECTIVE IMPAIRMENTS: decreased activity tolerance, decreased balance, decreased coordination, decreased endurance, decreased mobility, difficulty walking, decreased ROM, decreased strength, increased fascial restrictions, impaired perceived functional ability, impaired flexibility, improper body mechanics, and pain.   ACTIVITY LIMITATIONS: lifting, bending, standing, squatting, transfers, and locomotion level  PARTICIPATION LIMITATIONS:  running  PERSONAL FACTORS:  no specific factors  are also affecting patient's functional outcome.   REHAB POTENTIAL: Good  CLINICAL DECISION MAKING: Stable/uncomplicated  EVALUATION COMPLEXITY:  Low   GOALS: Goals reviewed with  patient? Yes  SHORT TERM GOALS: (target date for Short term goals are 3 weeks 05/31/2023)  1. Patient will demonstrate independent use of home exercise program to maintain progress from in clinic treatments.  Goal status: Met   LONG TERM GOALS: (target dates for all long term goals are 10 weeks  07/19/2023 )   1. Patient will demonstrate/report pain at worst less than or equal to 2/10 to facilitate minimal limitation in daily activity secondary to pain symptoms.  Goal status: on going 05/30/2023   2. Patient will demonstrate independent use of home exercise program to facilitate ability to maintain/progress functional gains from skilled physical therapy services.  Goal status: on going 05/30/2023   3. Patient will demonstrate FOTO outcome > or = 87 % to indicate reduced disability due to condition.  Goal status: on going 05/30/2023   4. Patient will demonstrate bilateral hip MMT 5/5 throughout to facilitate usual daily and running activity at PLOF.   Goal status: on going 05/30/2023   5.  Patient will demonstrate Rt SLS 30 seconds s deviation/trendelenburg for equalling Lt.   Goal status: on going 05/30/2023   6.  Patient will demonstrate return to running s symptoms.  Goal status: on going 05/30/2023     PLAN:  PT FREQUENCY: 1-2x/week  PT DURATION: 10 weeks  PLANNED INTERVENTIONS: 97146- PT Re-evaluation, 97110-Therapeutic exercises, 97530- Therapeutic activity, 97112- Neuromuscular re-education, 97535- Self Care, 16109- Manual therapy, [phone]- Gait training, [phone]- Orthotic Fit/training, [phone]- Canalith repositioning, [id number]- Aquatic Therapy, 97014- Electrical stimulation (unattended), [id number]- Electrical stimulation (manual), [id number]- Vasopneumatic device, [id number]- Ultrasound, [id number]- Traction (mechanical), [id number]- Ionotophoresis 4mg /ml Dexamethasone, Patient/Family education, Balance training, Stair training, Taping, Dry Needling, Joint mobilization,  Joint manipulation, Spinal manipulation, Spinal mobilization, Scar mobilization, Vestibular training, Visual/preceptual remediation/compensation, DME instructions, Cryotherapy, and Moist heat.  All preferred as medically necessary.  All included unless contraindicated  PLAN FOR NEXT SESSION: Dry needling, myofascial release.    Chyrel Masson, PT, DPT, OCS, ATC 05/30/23  12:17 PM

## 2023-06-01 ENCOUNTER — Other Ambulatory Visit (HOSPITAL_COMMUNITY): Payer: Self-pay

## 2023-06-02 ENCOUNTER — Encounter: Payer: BC Managed Care – PPO | Admitting: Physical Therapy

## 2023-06-07 ENCOUNTER — Encounter: Payer: Self-pay | Admitting: Physical Therapy

## 2023-06-07 ENCOUNTER — Ambulatory Visit: Payer: BC Managed Care – PPO | Admitting: Physical Therapy

## 2023-06-07 DIAGNOSIS — M6281 Muscle weakness (generalized): Secondary | ICD-10-CM

## 2023-06-07 DIAGNOSIS — M79604 Pain in right leg: Secondary | ICD-10-CM | POA: Diagnosis not present

## 2023-06-07 DIAGNOSIS — M25551 Pain in right hip: Secondary | ICD-10-CM

## 2023-06-07 NOTE — Therapy (Signed)
OUTPATIENT PHYSICAL THERAPY TREATMENT   Patient Name: Alexandra Bradley MRN: [id number] DOB:1981-06-07, 42 y.o., female Today's Date: 06/07/2023  END OF SESSION:  PT End of Session - 06/07/23 1159     Visit Number 3    Number of Visits 20    Date for PT Re-Evaluation 07/19/23    Authorization Type [employer] $75 copay 20 visits    Authorization - Number of Visits 20    Progress Note Due on Visit 10    PT Start Time 1145    PT Stop Time 1159    PT Time Calculation (min) 14 min    Activity Tolerance Patient tolerated treatment well    Behavior During Therapy [facility] for tasks assessed/performed              Past Medical History:  Diagnosis Date   Chicken pox    Hyperlipidemia    Past Surgical History:  Procedure Laterality Date   breast biopsy  2005 and 2006   BREAST BIOPSY     Patient Active Problem List   Diagnosis Date Noted   Genital herpes simplex 10/28/2019   Needle stick, hypodermic, accidental 06/14/2019   Vitamin D deficiency 08/18/2017   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   Hyperlipidemia 10/12/2012   Routine general medical examination at a health care facility 10/12/2012    PCP: Sheliah Hatch MD  REFERRING PROVIDER: Amador Cunas, PA-C  REFERRING DIAG: [phone] (ICD-10-CM) - Pain of right hip  Rationale for Evaluation and Treatment: Rehabilitation  THERAPY DIAG:  Pain in right hip  Pain in right leg  Muscle weakness (generalized)  ONSET DATE: 04/2023  SUBJECTIVE:                                                                                                                                                                                           SUBJECTIVE STATEMENT:She reports getting better, she will run her marathon this weekend and would like to have DN treatment.Marland Kitchen   PERTINENT HISTORY:  Unremarkable   PAIN:  NPRS scale: at worst 2-3 /10 Pain location: Rt hip anterior and lateral hip as well Pain description:  pulling/tightness Aggravating factors: walking, running Relieving factors: resting, stretching for short term.   PRECAUTIONS: None  WEIGHT BEARING RESTRICTIONS: No  FALLS:  Has patient fallen in last 6 months? No  LIVING ENVIRONMENT:  Lives in: House/apartment Stairs: stairs at home without pain.    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     OBJECTIVE:   PATIENT SURVEYS:  05/30/2023  FOTO:  83  05/10/2023  FOTO eval:  69  predicted:  87  SCREENING FOR RED FLAGS: 05/10/2023 Bowel or bladder incontinence: No Cauda equina syndrome: No  COGNITION: 05/10/2023 Overall cognitive status: WFL normal      SENSATION: 05/10/2023 WFL  MUSCLE LENGTH: 05/10/2023 (+) Thomas test on Rt, (+) Elys test  POSTURE:  05/10/2023 No Significant postural limitations  PALPATION: 05/10/2023 Trigger points in Rt glute med/min, TFL with concordant symptoms.   LOWER EXTREMITY ROM:      Right 05/10/2023 Left 05/10/2023  Hip flexion [facility]  Hip extension    Hip abduction    Hip adduction    Hip internal rotation [facility]  Hip external rotation [facility]  Knee flexion    Knee extension    Ankle dorsiflexion    Ankle plantarflexion    Ankle inversion    Ankle eversion     (Blank rows = not tested)  LOWER EXTREMITY MMT:    MMT Right 05/10/2023 Left 05/10/2023 Right 05/30/2023  Hip flexion 4/5 c pain 5/5 5/5  Hip extension 5/5 5/5   Hip abduction 3+/5 4+/5 4/5  Hip adduction     Hip internal rotation     Hip external rotation     Knee flexion 5/5 5/5   Knee extension 5/5 5/5   Ankle dorsiflexion 5/5 5/5   Ankle plantarflexion     Ankle inversion     Ankle eversion      (Blank rows = not tested)   SPECIAL TESTS:  05/10/2023 No specific testing today.    FUNCTIONAL TESTS:  05/10/2023 Lt SLS: 30 seconds s deviation Rt SLS: 30 seconds c mild increase in aberrant movement,  increased in lateral hip control deficits 18 inch chair transfer without deviation, no UE required.  Similar in free squat  GAIT: 05/10/2023 Independent ambulation                                                                                                                                                                                                                   TODAY'S TREATMENT:  DATE: 06/07/2023  Therex: Verbal instructions and demonstration for dynamic stretching and warm up before she runs marathon.  Manual Compression to Rt glute med and active compression with skilled palpation durin DN  Trigger Point Dry-Needling  Treatment instructions: Expect mild to moderate muscle soreness. S/S of pneumothorax if dry needled over a lung field, and to seek immediate medical attention should they occur. Patient verbalized understanding of these instructions and education.  Patient Consent Given: Yes Education handout provided: previously provided Muscles treated: Rt glute med/min Treatment response/outcome: twitch response with fair tolerance in moment. No adverse reaction noted after.  DATE: 05/30/2023  Therex: Verbal review of continued HEP usage as well as progression in reps, holds to challenge further.   Manual Compression to Rt glute med. Percussive device to Rt lateral thigh  Trigger Point Dry-Needling  Treatment instructions: Expect mild to moderate muscle soreness. S/S of pneumothorax if dry needled over a lung field, and to seek immediate medical attention should they occur. Patient verbalized understanding of these instructions and education.  Patient Consent Given: Yes Education handout provided: Yes Muscles treated: Rt glute med/min Treatment response/outcome: twitch response with poor to fair tolerance in moment.   TODAY'S TREATMENT:                                                                                                          DATE: 05/10/2023  Therex:    HEP instruction/performance c cues for techniques, handout provided.  Trial set performed of each for comprehension and symptom assessment.  See below for exercise list  Manual Compression to Rt glute med  Trigger Point Dry-Needling  Treatment instructions: Expect mild to moderate muscle soreness. S/S of pneumothorax if dry needled over a lung field, and to seek immediate medical attention should they occur. Patient verbalized understanding of these instructions and education.  Patient Consent Given: Yes Education handout provided: Yes Muscles treated: Rt glute med/min Treatment response/outcome: twitch response with poor to fair tolerance in moment.    PATIENT EDUCATION:  05/10/2023 Education details: HEP, POC Person educated: Patient Education method: Programmer, multimedia, Demonstration, Verbal cues, and Handouts Education comprehension: verbalized understanding, returned demonstration, and verbal cues required  HOME EXERCISE PROGRAM: Access Code: [email or web address] URL: [email or web address] Date: 05/10/2023 Prepared by: Chyrel Masson  Exercises - Hip Flexor Stretch at [facility] of Bed  - 1-2 x daily - 7 x weekly - 1 sets - 5 reps - 15 hold - Supine Piriformis Stretch with Foot on Ground  - 2 x daily - 7 x weekly - 1 sets - 5 reps - 30 hold - Figure 4 Bridge  - 1-2 x daily - 7 x weekly - 3 sets - 10 reps - Modified Side Plank with Hip Abduction  - 1 x daily - 7 x weekly - 3 sets - 10 reps - Standing Hip Hiking  - 1-2 x daily - 7 x weekly - 1 sets - 10 reps - 5 hold  ASSESSMENT:  CLINICAL IMPRESSION: DN performed for her at her request as she feels this is helping. She will run  marathon this weekend and I did review dynamic stretches and warm up instructions for her. She will follow up after her race if needed.   OBJECTIVE IMPAIRMENTS: decreased activity tolerance, decreased  balance, decreased coordination, decreased endurance, decreased mobility, difficulty walking, decreased ROM, decreased strength, increased fascial restrictions, impaired perceived functional ability, impaired flexibility, improper body mechanics, and pain.   ACTIVITY LIMITATIONS: lifting, bending, standing, squatting, transfers, and locomotion level  PARTICIPATION LIMITATIONS:  running  PERSONAL FACTORS:  no specific factors  are also affecting patient's functional outcome.   REHAB POTENTIAL: Good  CLINICAL DECISION MAKING: Stable/uncomplicated  EVALUATION COMPLEXITY: Low   GOALS: Goals reviewed with patient? Yes  SHORT TERM GOALS: (target date for Short term goals are 3 weeks 05/31/2023)  1. Patient will demonstrate independent use of home exercise program to maintain progress from in clinic treatments.  Goal status: Met   LONG TERM GOALS: (target dates for all long term goals are 10 weeks  07/19/2023 )   1. Patient will demonstrate/report pain at worst less than or equal to 2/10 to facilitate minimal limitation in daily activity secondary to pain symptoms.  Goal status: on going 05/30/2023   2. Patient will demonstrate independent use of home exercise program to facilitate ability to maintain/progress functional gains from skilled physical therapy services.  Goal status: on going 05/30/2023   3. Patient will demonstrate FOTO outcome > or = 87 % to indicate reduced disability due to condition.  Goal status: on going 05/30/2023   4. Patient will demonstrate bilateral hip MMT 5/5 throughout to facilitate usual daily and running activity at PLOF.   Goal status: on going 05/30/2023   5.  Patient will demonstrate Rt SLS 30 seconds s deviation/trendelenburg for equalling Lt.   Goal status: on going 05/30/2023   6.  Patient will demonstrate return to running s symptoms.  Goal status: on going 05/30/2023     PLAN:  PT FREQUENCY: 1-2x/week  PT DURATION: 10 weeks  PLANNED  INTERVENTIONS: 97146- PT Re-evaluation, 97110-Therapeutic exercises, 97530- Therapeutic activity, 97112- Neuromuscular re-education, 97535- Self Care, 29528- Manual therapy, [phone]- Gait training, [phone]- Orthotic Fit/training, [phone]- Canalith repositioning, [id number]- Aquatic Therapy, 97014- Electrical stimulation (unattended), [id number]- Electrical stimulation (manual), [id number]- Vasopneumatic device, [id number]- Ultrasound, [id number]- Traction (mechanical), [id number]- Ionotophoresis 4mg /ml Dexamethasone, Patient/Family education, Balance training, Stair training, Taping, Dry Needling, Joint mobilization, Joint manipulation, Spinal manipulation, Spinal mobilization, Scar mobilization, Vestibular training, Visual/preceptual remediation/compensation, DME instructions, Cryotherapy, and Moist heat.  All preferred as medically necessary.  All included unless contraindicated  PLAN FOR NEXT SESSION: Dry needling, myofascial release.   Ivery Quale, PT, DPT 06/07/23 12:00 PM

## 2023-06-15 ENCOUNTER — Encounter: Payer: Self-pay | Admitting: Family Medicine

## 2023-06-29 ENCOUNTER — Other Ambulatory Visit: Payer: Self-pay | Admitting: Obstetrics & Gynecology

## 2023-06-29 DIAGNOSIS — Z803 Family history of malignant neoplasm of breast: Secondary | ICD-10-CM

## 2023-07-03 ENCOUNTER — Other Ambulatory Visit: Payer: Self-pay | Admitting: Obstetrics & Gynecology

## 2023-07-03 DIAGNOSIS — R928 Other abnormal and inconclusive findings on diagnostic imaging of breast: Secondary | ICD-10-CM

## 2023-07-04 ENCOUNTER — Other Ambulatory Visit: Payer: Self-pay | Admitting: Obstetrics & Gynecology

## 2023-07-04 DIAGNOSIS — Z803 Family history of malignant neoplasm of breast: Secondary | ICD-10-CM

## 2023-07-24 ENCOUNTER — Ambulatory Visit
Admission: RE | Admit: 2023-07-24 | Discharge: 2023-07-24 | Disposition: A | Discharge status: Home / Self Care | Payer: BC Managed Care – PPO | Source: Ambulatory Visit | Attending: Obstetrics & Gynecology | Admitting: Obstetrics & Gynecology

## 2023-07-24 ENCOUNTER — Ambulatory Visit: Payer: BC Managed Care – PPO

## 2023-07-24 DIAGNOSIS — R928 Other abnormal and inconclusive findings on diagnostic imaging of breast: Secondary | ICD-10-CM

## 2023-08-22 ENCOUNTER — Other Ambulatory Visit: Payer: BC Managed Care – PPO

## 2023-09-11 ENCOUNTER — Encounter: Payer: BC Managed Care – PPO | Admitting: Family Medicine

## 2023-09-25 ENCOUNTER — Encounter: Payer: BC Managed Care – PPO | Admitting: Family Medicine

## 2023-12-14 ENCOUNTER — Ambulatory Visit (INDEPENDENT_AMBULATORY_CARE_PROVIDER_SITE_OTHER): Payer: BC Managed Care – PPO | Admitting: Family Medicine

## 2023-12-14 ENCOUNTER — Encounter: Payer: Self-pay | Admitting: Family Medicine

## 2023-12-14 VITALS — BP 114/72 | HR 62 | Ht 63.0 in | Wt 144.0 lb

## 2023-12-14 DIAGNOSIS — Z0000 Encounter for general adult medical examination without abnormal findings: Secondary | ICD-10-CM | POA: Diagnosis not present

## 2023-12-14 DIAGNOSIS — E559 Vitamin D deficiency, unspecified: Secondary | ICD-10-CM | POA: Diagnosis not present

## 2023-12-14 DIAGNOSIS — E785 Hyperlipidemia, unspecified: Secondary | ICD-10-CM | POA: Diagnosis not present

## 2023-12-14 LAB — CBC WITH DIFFERENTIAL/PLATELET
Basophils Absolute: 0 10*3/uL (ref 0.0–0.1)
Basophils Relative: 0.8 % (ref 0.0–3.0)
Eosinophils Absolute: 0.1 10*3/uL (ref 0.0–0.7)
Eosinophils Relative: 1.6 % (ref 0.0–5.0)
HCT: 38.9 % (ref 36.0–46.0)
Hemoglobin: 13.1 g/dL (ref 12.0–15.0)
Lymphocytes Relative: 46.6 % — ABNORMAL HIGH (ref 12.0–46.0)
Lymphs Abs: 2 10*3/uL (ref 0.7–4.0)
MCHC: 33.7 g/dL (ref 30.0–36.0)
MCV: 93.3 fl (ref 78.0–100.0)
Monocytes Absolute: 0.4 10*3/uL (ref 0.1–1.0)
Monocytes Relative: 8.7 % (ref 3.0–12.0)
Neutro Abs: 1.8 10*3/uL (ref 1.4–7.7)
Neutrophils Relative %: 42.3 % — ABNORMAL LOW (ref 43.0–77.0)
Platelets: 311 10*3/uL (ref 150.0–400.0)
RBC: 4.17 Mil/uL (ref 3.87–5.11)
RDW: 12.4 % (ref 11.5–15.5)
WBC: 4.2 10*3/uL (ref 4.0–10.5)

## 2023-12-14 LAB — BASIC METABOLIC PANEL WITH GFR
BUN: 18 mg/dL (ref 6–23)
CO2: 28 meq/L (ref 19–32)
Calcium: 9.6 mg/dL (ref 8.4–10.5)
Chloride: 104 meq/L (ref 96–112)
Creatinine, Ser: 0.88 mg/dL (ref 0.40–1.20)
GFR: 81.97 mL/min (ref >60.00)
Glucose, Bld: 81 mg/dL (ref 70–99)
Potassium: 4.7 meq/L (ref 3.5–5.1)
Sodium: 136 meq/L (ref 135–145)

## 2023-12-14 LAB — LIPID PANEL
Cholesterol: 234 mg/dL — ABNORMAL HIGH (ref 0–200)
HDL: 91.5 mg/dL (ref >39.00)
LDL Cholesterol: 132 mg/dL — ABNORMAL HIGH (ref 0–99)
NonHDL: 142.64
Total CHOL/HDL Ratio: 3
Triglycerides: 55 mg/dL (ref 0.0–149.0)
VLDL: 11 mg/dL (ref 0.0–40.0)

## 2023-12-14 LAB — HEPATIC FUNCTION PANEL
ALT: 10 U/L (ref 0–35)
AST: 18 U/L (ref 0–37)
Albumin: 4.6 g/dL (ref 3.5–5.2)
Alkaline Phosphatase: 41 U/L (ref 39–117)
Bilirubin, Direct: 0.1 mg/dL (ref 0.0–0.3)
Total Bilirubin: 0.7 mg/dL (ref 0.2–1.2)
Total Protein: 7.3 g/dL (ref 6.0–8.3)

## 2023-12-14 LAB — TSH: TSH: 0.61 u[IU]/mL (ref 0.35–5.50)

## 2023-12-14 LAB — VITAMIN D 25 HYDROXY (VIT D DEFICIENCY, FRACTURES): VITD: 55.34 ng/mL (ref 30.00–100.00)

## 2023-12-14 NOTE — Assessment & Plan Note (Signed)
Pt's PE WNL.  UTD on pap, mammo, Tdap.  Check labs.  Anticipatory guidance provided.  

## 2023-12-14 NOTE — Progress Notes (Signed)
   Subjective:    Patient ID: Alexandra Bradley, female    DOB: Apr 04, 1981, 43 y.o.   MRN: [id number]  HPI CPE- UTD on pap ([facility]), mammo, Tdap.  Patient Care Team    Relationship Specialty Notifications Start End  Jess Morita, MD PCP - General Family Medicine  08/18/17   Dyanna Glasgow, DO Consulting Physician Obstetrics and Gynecology  08/18/17     Health Maintenance  Topic Date Due   Cervical Cancer Screening (HPV/Pap Cotest)  08/19/2019   INFLUENZA VACCINE  02/23/2024   DTaP/Tdap/Td (3 - Td or Tdap) 11/11/2025   HIV Screening  Completed   HPV VACCINES  Aged Out   Meningococcal B Vaccine  Aged Out   COVID-19 Vaccine  Discontinued   Hepatitis C Screening  Discontinued      Review of Systems Patient reports no vision/ hearing changes, adenopathy,fever, persistant/recurrent hoarseness , swallowing issues, chest pain, palpitations, edema, persistant/recurrent cough, hemoptysis, dyspnea (rest/exertional/paroxysmal nocturnal), gastrointestinal bleeding (melena, rectal bleeding), abdominal pain, significant heartburn, bowel changes, GU symptoms (dysuria, hematuria, incontinence), Gyn symptoms (abnormal  bleeding, pain),  syncope, focal weakness, memory loss, numbness & tingling, skin/hair/nail changes, abnormal bruising or bleeding, anxiety, or depression.   + 14 lb weight loss    Objective:   Physical Exam General Appearance:    Alert, cooperative, no distress, appears stated age  Head:    Normocephalic, without obvious abnormality, atraumatic  Eyes:    PERRL, conjunctiva/corneas clear, EOM's intact both eyes  Ears:    Normal TM's and external ear canals, both ears  Nose:   Nares normal, septum midline, mucosa normal, no drainage    or sinus tenderness  Throat:   Lips, mucosa, and tongue normal; teeth and gums normal  Neck:   Supple, symmetrical, trachea midline, no adenopathy;    Thyroid : no enlargement/tenderness/nodules  Back:     Symmetric, no curvature, ROM  normal, no CVA tenderness  Lungs:     Clear to auscultation bilaterally, respirations unlabored  Chest Wall:    No tenderness or deformity   Heart:    Regular rate and rhythm, S1 and S2 normal, no murmur, rub   or gallop  Breast Exam:    Deferred to GYN  Abdomen:     Soft, non-tender, bowel sounds active all four quadrants,    no masses, no organomegaly  Genitalia:    Deferred to GYN  Rectal:    Extremities:   Extremities normal, atraumatic, no cyanosis or edema  Pulses:   2+ and symmetric all extremities  Skin:   Skin color, texture, turgor normal, no rashes or lesions  Lymph nodes:   Cervical, supraclavicular, and axillary nodes normal  Neurologic:   CNII-XII intact, normal strength, sensation and reflexes    throughout          Assessment & Plan:

## 2023-12-14 NOTE — Patient Instructions (Signed)
 Follow up in 1 year or as needed We'll notify you of your lab results and make any changes if needed Keep up the good work on healthy diet and regular exercise- you look great! Make sure you are drinking lots of water and eating a good amount of fiber to help regulate bowels Call with any questions or concerns Stay Safe!  Stay Healthy! Have a great summer!!!

## 2023-12-15 ENCOUNTER — Ambulatory Visit: Payer: Self-pay | Admitting: Family Medicine

## 2023-12-22 DIAGNOSIS — N907 Vulvar cyst: Secondary | ICD-10-CM | POA: Diagnosis not present

## 2024-01-02 ENCOUNTER — Other Ambulatory Visit: Payer: BC Managed Care – PPO

## 2024-01-10 ENCOUNTER — Ambulatory Visit: Admitting: Dietician

## 2024-02-20 ENCOUNTER — Encounter: Attending: Family Medicine | Admitting: Dietician

## 2024-02-20 ENCOUNTER — Encounter: Payer: Self-pay | Admitting: Dietician

## 2024-02-20 VITALS — Wt 149.7 lb

## 2024-02-20 DIAGNOSIS — E785 Hyperlipidemia, unspecified: Secondary | ICD-10-CM | POA: Insufficient documentation

## 2024-02-20 NOTE — Patient Instructions (Signed)
 Goals Established by Pt  Goal 1: aim to get 1T ground flaxseed daily.   Goal 2: try a lentil/bean pasta.   Goal 3: aim for having non-starchy vegetables with 2 meals.   Soluble fiber and impact on lowering cholesterol: Sources: Oats, barley, beans, lentils, fruits (apples, oranges), vegetables (carrots, broccoli), and flaxseeds. Benefits: Soluble fiber reduces the absorption of cholesterol into your bloodstream. Choose Healthy Unsaturated Fats and Omega 3's. Sources: Olive oil, canola oil, avocados, nuts, and Fatty fish (salmon, trout), walnuts, flaxseeds, and chia seeds. Benefits: Helps raise HDL cholesterol and lower LDL cholesterol. Omega-3s help reduce triglycerides and raise HDL cholesterol. Limit Saturated Fats: Sources: Red meat, full-fat dairy products, butter, and coconut oil. Benefits: Reducing intake helps lower LDL cholesterol levels. Eat Plenty of Fruits and Vegetables. Benefits: High in fiber and antioxidants, which help lower LDL cholesterol. Eat Whole Grains. Sources: Brown rice, whole wheat bread, quinoa, and whole grain pasta. Benefits: Whole grains contain more fiber and nutrients than refined grains.

## 2024-02-20 NOTE — Progress Notes (Signed)
 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   Pt reports she has been having issues with her cholesterol labs on and off for a few years. Pt states it will go up, and she and her doctor discuss meds, then she works really hard toward getting it down and it comes down some but this time it did not come down. Pt reports family history of high cholesterol.  Pt reports she works out regularly, a Radio broadcast assistant, HIIT, and running. Pt reports she ran a marathon last year and otherwise is now signing up for smaller races.   Pt reports she cooks every other night. Pt states she goes to the grocery store every 2 weeks. Pt reports in the household is her, her husband, and their 3 kids. Pt states some of her kids are picky which makes vegetable intake difficult.   Estimated daily fluid intake: 40-80 oz Supplements: MVI, omega 3, vitamin d , vitamin c, probiotic Sleep: 'great', 7 hours Stress / self-care: pt reports low stress Current average weekly physical activity: gym 4x/wk, some running  24-Hr Dietary Recall First Meal: eggs/egg whites and sometimes [place] or or pork bacon and [employer] toast OR overnight oats OR cheerios Snack: fruit Second Meal: ham and cheese sandwich on [employer] bread and salad OR tuna and crackers OR pizza Snack: yogurt OR protein shake Third Meal: tacos OR chicken parm with rice and broccoli Snack: none OR weekends ice cream Beverages: water, 1 energy drink,    NUTRITION DIAGNOSIS   Everly-2.2 Altered nutrition-related laboratory As related to HLD.  As evidenced by chol 234, LDL 132.   NUTRITION INTERVENTION  Nutrition education (E-1) on the following topics:   LDL Lowering Nutrition Therapy Soluble fiber and impact on lowering cholesterol: Sources: Oats, barley, beans, lentils, fruits (apples, oranges), vegetables (carrots, broccoli), and flaxseeds. Benefits: Soluble fiber reduces the absorption of cholesterol into your bloodstream. Choose Healthy Unsaturated Fats and Omega 3's. Sources: Olive oil, canola oil, avocados, nuts, and Fatty fish (salmon, trout), walnuts, flaxseeds, and chia seeds. Benefits: Helps raise HDL cholesterol and lower LDL cholesterol. Omega-3s help reduce triglycerides and raise HDL cholesterol. Limit Saturated Fats: Sources: Red meat, full-fat dairy products, butter, and coconut oil. Benefits: Reducing intake helps lower LDL cholesterol levels. Eat Plenty of Fruits and Vegetables. Benefits: High in fiber and antioxidants, which help lower LDL cholesterol. Eat Whole Grains. Sources: Brown rice, whole wheat bread, quinoa, and whole grain pasta. Benefits: Whole grains contain more fiber and nutrients than refined grains.  Fiber Dietary fiber is essential for health and comes in two types: soluble and insoluble fiber. Soluble Fiber: Characteristics: Dissolves in water, forming a gel-like substance. Sources: Oats, nuts, seeds, beans, lentils, fruits (apples, citrus), and vegetables (carrots). Benefits: Regulates blood sugar, lowers LDL cholesterol, supports heart health, and aids in digestion by forming a gel that prevents diarrhea. Insoluble Fiber: Characteristics: Does not dissolve in water and adds bulk to stool. Sources: Whole grains, bran, nuts, seeds, vegetables (cauliflower, green beans), and fruits (apples with skin, berries). Benefits: Promotes regular bowel  movements, aids in weight management, and prevents diverticular disease.  Plate Method/Food  Groups Fruits & Vegetables: Aim to fill half your plate with a variety of fruits and vegetables. They are rich in vitamins, minerals, and fiber, and can help reduce the risk of chronic diseases. Choose a colorful assortment of fruits and vegetables to ensure you get a wide range of nutrients. Grains and Starches: Make at least half of your grain choices whole grains, such as brown rice, whole wheat bread, and oats. Whole grains provide fiber, which aids in digestion and healthy cholesterol levels. Aim for whole forms of starchy vegetables such as potatoes, sweet potatoes, beans, peas, and corn, which are fiber rich and provide many vitamins and minerals.  Protein: Incorporate lean sources of protein, such as poultry, fish, beans, nuts, and seeds, into your meals. Protein is essential for building and repairing tissues, staying full, balancing blood sugar, as well as supporting immune function. Dairy: Include low-fat or fat-free dairy products like milk, yogurt, and cheese in your diet. Dairy foods are excellent sources of calcium and vitamin D , which are crucial for bone health.   Handouts Provided Include  Plate Method  Learning Style & Readiness for Change Teaching method utilized: Visual & Auditory  Demonstrated degree of understanding via: Teach Back  Barriers to learning/adherence to lifestyle change: none  Goals Established by Pt  Goal 1: aim to get 1T ground flaxseed daily.   Goal 2: try a lentil/bean pasta.   Goal 3: aim for having non-starchy vegetables with 2 meals.    MONITORING & EVALUATION Dietary intake, weekly physical activity, and follow up in 2 months.  Next Steps  Patient is to call for questions.

## 2024-04-29 ENCOUNTER — Other Ambulatory Visit (HOSPITAL_COMMUNITY): Payer: Self-pay

## 2024-04-29 MED ORDER — FLUZONE 0.5 ML IM SUSY
0.5000 mL | PREFILLED_SYRINGE | Freq: Once | INTRAMUSCULAR | 0 refills | Status: AC
  Filled 2024-04-29: qty 0.5, 1d supply, fill #0

## 2024-05-08 ENCOUNTER — Other Ambulatory Visit: Payer: Self-pay | Admitting: Medical Genetics

## 2024-05-08 DIAGNOSIS — Z006 Encounter for examination for normal comparison and control in clinical research program: Secondary | ICD-10-CM

## 2024-05-22 ENCOUNTER — Ambulatory Visit: Admitting: Dietician

## 2024-06-14 LAB — GENECONNECT MOLECULAR SCREEN

## 2024-06-17 ENCOUNTER — Telehealth: Payer: Self-pay | Admitting: Medical Genetics

## 2024-06-19 ENCOUNTER — Encounter: Payer: Self-pay | Admitting: Dietician

## 2024-06-19 ENCOUNTER — Encounter: Attending: Family Medicine | Admitting: Dietician

## 2024-06-19 VITALS — Wt 156.2 lb

## 2024-06-19 DIAGNOSIS — E785 Hyperlipidemia, unspecified: Secondary | ICD-10-CM | POA: Insufficient documentation

## 2024-06-19 NOTE — Progress Notes (Signed)
 Medical Nutrition Therapy  Appointment Start time:  1620  Appointment End time:  1645  Primary concerns today: cholesterol labs   Referral diagnosis: Employee Visit 2 Preferred learning style: no preference indicated Learning readiness: ready   NUTRITION ASSESSMENT   Anthropometrics   Wt 06/19/24: 156.2 lb Wt 02/20/24: 149.7 lb   Clinical Medical Hx: reviewed; HLD Medications: reviewed Labs: 12/14/23: chol 234, LDL 132 Notable Signs/Symptoms: none reported Food Allergies: none  Lifestyle & Dietary Hx  Pt present today with 2 of her kids, Raven and Riley.   Pt reports she feels her eating habits have fallen off track some. Pt states when she is more busy she has a hard time with planning ahead. Pt reports she has been packing her lunch most days but about 3 days a week she may not eat her packed lunch and make a last minute decision to get pizza from the cafeteria.   Pt reports she has continued with weight training but has been running less. Pt reports she feels she wants to get back into running.    Estimated daily fluid intake: 60 oz Supplements: MVI, omega 3, vitamin d , vitamin c, probiotic Sleep: 'great', 7 hours Stress / self-care: pt reports low stress Current average weekly physical activity: gym 4x/wk  24-Hr Dietary Recall First Meal: overnight oats OR honey nut cheerios OR egg whites, turkey bacon, toast Snack: fruit Second Meal: pizza OR salad and sandwich Snack: yogurt OR protein shake Third Meal: tacos OR meatballs and mac and cheese and green beans Snack: none OR weekends ice cream Beverages: water   NUTRITION DIAGNOSIS  [place]-2.2 Altered nutrition-related laboratory As related to HLD.  As evidenced by chol 234, LDL 132.   NUTRITION INTERVENTION  Nutrition education (E-1) on the following topics:   LDL Lowering Nutrition Therapy Soluble fiber and impact on lowering cholesterol: Sources: Oats, barley, beans, lentils, fruits (apples, oranges),  vegetables (carrots, broccoli), and flaxseeds. Benefits: Soluble fiber reduces the absorption of cholesterol into your bloodstream. Choose Healthy Unsaturated Fats and Omega 3's. Sources: Olive oil, canola oil, avocados, nuts, and Fatty fish (salmon, trout), walnuts, flaxseeds, and chia seeds. Benefits: Helps raise HDL cholesterol and lower LDL cholesterol. Omega-3s help reduce triglycerides and raise HDL cholesterol. Limit Saturated Fats: Sources: Red meat, full-fat dairy products, butter, and coconut oil. Benefits: Reducing intake helps lower LDL cholesterol levels. Eat Plenty of Fruits and Vegetables. Benefits: High in fiber and antioxidants, which help lower LDL cholesterol. Eat Whole Grains. Sources: Brown rice, whole wheat bread, quinoa, and whole grain pasta. Benefits: Whole grains contain more fiber and nutrients than refined grains.  Fiber Dietary fiber is essential for health and comes in two types: soluble and insoluble fiber. Soluble Fiber: Characteristics: Dissolves in water, forming a gel-like substance. Sources: Oats, nuts, seeds, beans, lentils, fruits (apples, citrus), and vegetables (carrots). Benefits: Regulates blood sugar, lowers LDL cholesterol, supports heart health, and aids in digestion by forming a gel that prevents diarrhea. Insoluble Fiber: Characteristics: Does not dissolve in water and adds bulk to stool. Sources: Whole grains, bran, nuts, seeds, vegetables (cauliflower, green beans), and fruits (apples with skin, berries). Benefits: Promotes regular bowel movements, aids in weight management, and prevents diverticular disease.  Plate Method/Food Groups Fruits & Vegetables: Aim to fill half your plate with a variety of fruits and vegetables. They are rich in vitamins, minerals, and fiber, and can help reduce the risk of chronic diseases. Choose a colorful assortment of fruits and vegetables to ensure you get a wide range  of nutrients. Grains and Starches: Make at least half  of your grain choices whole grains, such as brown rice, whole wheat bread, and oats. Whole grains provide fiber, which aids in digestion and healthy cholesterol levels. Aim for whole forms of starchy vegetables such as potatoes, sweet potatoes, beans, peas, and corn, which are fiber rich and provide many vitamins and minerals.  Protein: Incorporate lean sources of protein, such as poultry, fish, beans, nuts, and seeds, into your meals. Protein is essential for building and repairing tissues, staying full, balancing blood sugar, as well as supporting immune function. Dairy: Include low-fat or fat-free dairy products like milk, yogurt, and cheese in your diet. Dairy foods are excellent sources of calcium and vitamin D , which are crucial for bone health.   Handouts Provided Include (initial assessment) Plate Method  Learning Style & Readiness for Change Teaching method utilized: Visual & Auditory  Demonstrated degree of understanding via: Teach Back  Barriers to learning/adherence to lifestyle change: none  Assessment of Previous Goals Established by Pt  Goal 1: aim to get 1T ground flaxseed daily. - goal in progress, continue!  Goal 2: try a lentil/bean pasta. - goal met, continue!  Goal 3: aim for having non-starchy vegetables with 2 meals. - goal met, continue!  New Goals:   Goal 1: consider adding back in running.   Goal 2: work on adding variety to lunches to encourage you to eat them.   Goal 3: do some ingredient or meal prep at the beginning of the week.   Things to try: [employer] ranch or homemade ranch with greek yogurt and cottage cheese.   MONITORING & EVALUATION Dietary intake, weekly physical activity, and follow up in 2 months.  Next Steps  Patient is to call for questions.

## 2024-07-03 NOTE — Telephone Encounter (Signed)
 [facility]   07/03/2024 10:25AM  Attempted to contact participant 3 times via phone call and MyChart message to discuss TNP results and offer a new [facility] order. Participant may contact the research team at any time to discuss TNP results and request a new [facility] order.   Best,  Jordyn Pennstrom, BS [place]  [facility] Clinical Research Specialist II Direct Dial: [phone]  Fax: [phone]

## 2024-09-24 ENCOUNTER — Encounter: Admitting: Dietician

## 2024-12-18 ENCOUNTER — Encounter: Admitting: Family Medicine
